# Patient Record
Sex: Male | Born: 1990
Health system: Southern US, Community
[De-identification: ages and names within clinical notes are randomized; demographics above are authoritative.]

## PROBLEM LIST (undated history)

## (undated) DIAGNOSIS — F909 Attention-deficit hyperactivity disorder, unspecified type: Secondary | ICD-10-CM

## (undated) HISTORY — DX: Attention-deficit hyperactivity disorder, unspecified type: F90.9

## (undated) HISTORY — PX: CLOSED REDUCTION SHOULDER DISLOCATION: SUR242

## (undated) HISTORY — PX: FRACTURE SURGERY: SHX138

---
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
## 2013-04-02 ENCOUNTER — Emergency Department (HOSPITAL_COMMUNITY)
Admission: EM | Admit: 2013-04-02 | Discharge: 2013-04-02 | Disposition: A | Discharge status: Home / Self Care | Payer: Self-pay | Attending: Emergency Medicine | Admitting: Emergency Medicine

## 2013-04-02 ENCOUNTER — Encounter (HOSPITAL_COMMUNITY): Payer: Self-pay | Admitting: Emergency Medicine

## 2013-04-02 DIAGNOSIS — T1592XA Foreign body on external eye, part unspecified, left eye, initial encounter: Secondary | ICD-10-CM

## 2013-04-02 DIAGNOSIS — Y939 Activity, unspecified: Secondary | ICD-10-CM | POA: Insufficient documentation

## 2013-04-02 DIAGNOSIS — Y929 Unspecified place or not applicable: Secondary | ICD-10-CM | POA: Insufficient documentation

## 2013-04-02 DIAGNOSIS — T1590XA Foreign body on external eye, part unspecified, unspecified eye, initial encounter: Secondary | ICD-10-CM | POA: Insufficient documentation

## 2013-04-02 MED ORDER — TETRACAINE HCL 0.5 % OP SOLN
2.0000 [drp] | Freq: Once | OPHTHALMIC | Status: AC
  Administered 2013-04-02: 2 [drp] via OPHTHALMIC
  Filled 2013-04-02: qty 2

## 2013-04-02 MED ORDER — TRIAMCINOLONE ACETONIDE 0.025 % EX OINT: TOPICAL_OINTMENT | Freq: Two times a day (BID) | CUTANEOUS | Status: DC

## 2013-04-02 MED ORDER — FLUORESCEIN SODIUM 1 MG OP STRP
ORAL_STRIP | OPHTHALMIC | Status: AC
  Administered 2013-04-02: 03:00:00
  Filled 2013-04-02: qty 1

## 2013-04-02 NOTE — ED Provider Notes (Signed)
Medical screening examination/treatment/procedure(s) were performed by non-physician practitioner and as supervising physician I was immediately available for consultation/collaboration.  Huriel Matt M Amador Braddy, MD 04/02/13 0646 

## 2013-04-02 NOTE — ED Notes (Signed)
PT. REPORTS FOREIGN OBJECT AT LEFT EYE WITH IRRITATION /REDDNESS AND WATERY EYE. NO BLURRED VISION .

## 2013-04-02 NOTE — ED Provider Notes (Addendum)
History     CSN: [id number]  Arrival date & time 04/02/13  0150   First MD Initiated Contact with Patient 04/02/13 0210      Chief Complaint  Patient presents with  . Foreign Body in Eye    (Consider location/radiation/quality/duration/timing/severity/associated sxs/prior treatment) Patient is a 22 y.o. male presenting with foreign body in eye. The history is provided by the patient.  Foreign Body in Eye This is a new problem. The current episode started today (3 hrs ago ). The problem occurs constantly. The problem has been unchanged. Pertinent negatives include no abdominal pain, anorexia, arthralgias, change in bowel habit, chest pain, chills, congestion, fatigue, fever, headaches, joint swelling, myalgias, nausea, neck pain, numbness, rash, sore throat, swollen glands, urinary symptoms, vertigo, visual change, vomiting or weakness. Nothing aggravates the symptoms. He has tried nothing for the symptoms. The treatment provided no relief.    History reviewed. No pertinent past medical history.  History reviewed. No pertinent past surgical history.  No family history on file.  History  Substance Use Topics  . Smoking status: Never Smoker   . Smokeless tobacco: Not on file  . Alcohol Use: Yes      Review of Systems  Constitutional: Negative for fever, chills and fatigue.  HENT: Negative for congestion, sore throat and neck pain.   Eyes: Negative for photophobia, pain, discharge, redness, itching and visual disturbance.       FB sensation  Cardiovascular: Negative for chest pain.  Gastrointestinal: Negative for nausea, vomiting, abdominal pain, anorexia and change in bowel habit.  Musculoskeletal: Negative for myalgias, joint swelling and arthralgias.  Skin: Negative for rash.  Neurological: Negative for vertigo, weakness, numbness and headaches.  All other systems reviewed and are negative.    Allergies  Review of patient's allergies indicates no known  allergies.  Home Medications   Current Outpatient Rx  Name  Route  Sig  Dispense  Refill  . naphazoline-glycerin (CLEAR EYES) 0.012-0.2 % SOLN   Both Eyes   Place 1-2 drops into both eyes every 4 (four) hours as needed (for irritation).           BP 127/76  Pulse 65  Temp(Src) 97.7 F (36.5 C) (Oral)  Resp 14  SpO2 100%  Physical Exam  Nursing note and vitals reviewed. Constitutional: He is oriented to person, place, and time. He appears well-developed and well-nourished. No distress.  HENT:  Head: Normocephalic and atraumatic.  Eyes: Conjunctivae and EOM are normal. Pupils are equal, round, and reactive to light. No foreign body present in the left eye.  No tenderness to palpation over temporal arteries or orbital region. Pain free EOMs, visual acuity equal bilaterally, no increase in IOPs, no proptosis, lid swelling, hyphema, purulent discharge from eyes, or consensual photophobia.  Eyelids everted, no evidence of FB.  Fluorescein study: no corneal uptake, dendritic pattern, or evidence of corneal abrasions.  Neck: Normal range of motion. Neck supple.  Pulmonary/Chest: Effort normal.  Neurological: He is alert and oriented to person, place, and time.  Skin: Skin is warm and dry. No rash noted. He is not diaphoretic.  Psychiatric: His behavior is normal.    ED Course  FOREIGN BODY REMOVAL Date/Time: 04/02/2013 1:03 AM Performed by: Jaci Carrel Authorized by: Jaci Carrel Consent: Verbal consent obtained. Risks and benefits: risks, benefits and alternatives were discussed Consent given by: patient Patient understanding: patient states understanding of the procedure being performed Patient consent: the patient's understanding of the procedure matches consent given Patient identity  confirmed: arm band and verbally with patient Body area: eye Location details: left conjunctiva Patient sedated: no Localization method: eyelid eversion and visualized Removal mechanism:  irrigation and eyelid eversion Eye examined with fluorescein. No fluorescein uptake. No residual rust ring present. Depth: superficial Complexity: simple 1 objects recovered. Objects recovered: dirt spec Post-procedure assessment: foreign body removed Patient tolerance: Patient tolerated the procedure well with no immediate complications.   (including critical care time)  Labs Reviewed - No data to display No results found.   No diagnosis found.    MDM   FB (dirt) removed from left eye  Viral conjunctivitis  No purulent discharge, corneal abrasions, entrapment, consensual photophobia, or dendritic staining with fluorescein study.  Presentation non-concerning for iritis, bacterial conjunctivitis, corneal abrasions, or HSV.  No antibiotics are indicated  Patient advised to followup with ophthalmologist if symptoms persist or worsen in any way including vision change or purulent discharge.  Patient verbalizes understanding and is agreeable with discharge.         Jaci Carrel, PA-C 04/02/13 0238  Jaci Carrel, PA-C 04/15/13 [phone]

## 2013-04-15 NOTE — ED Provider Notes (Signed)
Medical screening examination/treatment/procedure(s) were performed by non-physician practitioner and as supervising physician I was immediately available for consultation/collaboration.  Olivia Mackie, MD 04/15/13 [phone]

## 2013-10-24 ENCOUNTER — Encounter: Payer: Self-pay | Admitting: *Deleted

## 2013-10-27 ENCOUNTER — Encounter: Payer: Self-pay | Admitting: Family Medicine

## 2013-10-27 ENCOUNTER — Ambulatory Visit (INDEPENDENT_AMBULATORY_CARE_PROVIDER_SITE_OTHER): Payer: Self-pay | Admitting: Family Medicine

## 2013-10-27 VITALS — BP 118/78 | Temp 98.2°F | Ht 74.0 in | Wt 206.0 lb

## 2013-10-27 DIAGNOSIS — R21 Rash and other nonspecific skin eruption: Secondary | ICD-10-CM

## 2013-10-27 MED ORDER — DOXYCYCLINE HYCLATE 100 MG PO CAPS: 100.0000 mg | ORAL_CAPSULE | Freq: Two times a day (BID) | ORAL | Status: DC

## 2013-10-27 MED ORDER — PREDNISONE 20 MG PO TABS: ORAL_TABLET | ORAL | Status: DC

## 2013-10-27 MED ORDER — TRIAMCINOLONE ACETONIDE 0.1 % EX CREA: 1.0000 "application " | TOPICAL_CREAM | Freq: Two times a day (BID) | CUTANEOUS | Status: DC

## 2013-10-27 NOTE — Progress Notes (Signed)
  Subjective:    Patient ID: Peter Yu, male    DOB: [birth year], 22 y.o.   MRN: [id number]  HPI Patient is here today due to his eczema. He is out of his medication and would also like to know if there are any tips on how to treat it.   No other concerns.   Acts up frequently  On further history patient has had slight penile discharge off-and-on for several weeks. He is sexually active. He has no insurance and would prefer not to be tested.  Review of Systems No abdominal pain no fever no chills no rash ROS otherwise negative    Objective:   Physical Exam  Alert lungs clear. Heart regular rate rhythm. Arms and legs significant eczema. Patchy on feet.      Assessment & Plan:  Impression flare of eczema. #2 urethritis with patient unable to pay for definitive tests plan Doxy 100 twice a day 10 days. Prednisone taper. Triamcinolone cream twice a day to affected area. Symptomatic care discussed. WSL

## 2015-08-13 HISTORY — PX: OPEN REDUCTION INTERNAL FIXATION (ORIF) TIBIA/FIBULA FRACTURE: SHX5992

## 2015-08-30 ENCOUNTER — Other Ambulatory Visit: Payer: Self-pay | Admitting: Family Medicine

## 2015-09-13 ENCOUNTER — Ambulatory Visit (HOSPITAL_COMMUNITY): Payer: BLUE CROSS/BLUE SHIELD | Attending: Orthopedic Surgery | Admitting: Physical Therapy

## 2015-09-13 DIAGNOSIS — R29898 Other symptoms and signs involving the musculoskeletal system: Secondary | ICD-10-CM | POA: Diagnosis present

## 2015-09-13 DIAGNOSIS — R269 Unspecified abnormalities of gait and mobility: Secondary | ICD-10-CM | POA: Diagnosis present

## 2015-09-13 DIAGNOSIS — S82402F Unspecified fracture of shaft of left fibula, subsequent encounter for open fracture type IIIA, IIIB, or IIIC with routine healing: Secondary | ICD-10-CM

## 2015-09-13 DIAGNOSIS — M25672 Stiffness of left ankle, not elsewhere classified: Secondary | ICD-10-CM

## 2015-09-13 DIAGNOSIS — X58XXXD Exposure to other specified factors, subsequent encounter: Secondary | ICD-10-CM | POA: Diagnosis not present

## 2015-09-13 DIAGNOSIS — S82202F Unspecified fracture of shaft of left tibia, subsequent encounter for open fracture type IIIA, IIIB, or IIIC with routine healing: Secondary | ICD-10-CM | POA: Insufficient documentation

## 2015-09-13 DIAGNOSIS — R262 Difficulty in walking, not elsewhere classified: Secondary | ICD-10-CM | POA: Diagnosis present
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
## 2015-09-14 ENCOUNTER — Ambulatory Visit (HOSPITAL_COMMUNITY): Payer: BLUE CROSS/BLUE SHIELD | Admitting: Physical Therapy

## 2015-09-14 DIAGNOSIS — S82202F Unspecified fracture of shaft of left tibia, subsequent encounter for open fracture type IIIA, IIIB, or IIIC with routine healing: Secondary | ICD-10-CM | POA: Diagnosis not present

## 2015-09-14 DIAGNOSIS — M25672 Stiffness of left ankle, not elsewhere classified: Secondary | ICD-10-CM

## 2015-09-14 DIAGNOSIS — R269 Unspecified abnormalities of gait and mobility: Secondary | ICD-10-CM

## 2015-09-14 DIAGNOSIS — R29898 Other symptoms and signs involving the musculoskeletal system: Secondary | ICD-10-CM

## 2015-09-14 DIAGNOSIS — R262 Difficulty in walking, not elsewhere classified: Secondary | ICD-10-CM

## 2015-09-14 DIAGNOSIS — S82402F Unspecified fracture of shaft of left fibula, subsequent encounter for open fracture type IIIA, IIIB, or IIIC with routine healing: Secondary | ICD-10-CM

## 2015-09-14 NOTE — Therapy (Signed)
[facility] [address] [place], [place], [address] Phone: [phone]   Fax:  [phone]  Physical Therapy Treatment  Patient Details  Name: Peter Yu MRN: [id number] Date of Birth: 09-03-1991 Referring Provider:  Merlyn Albert, MD  Encounter Date: 09/14/2015      PT End of Session - 09/14/15 1316    Visit Number 2   Number of Visits 18   Date for PT Re-Evaluation 10/13/15   Authorization Type BCBS- 50 visit limit   Authorization Time Period 09/13/15-11/08/15   PT Start Time 1102   PT Stop Time 1145   PT Time Calculation (min) 43 min   Activity Tolerance Patient tolerated treatment well   Behavior During Therapy [facility] for tasks assessed/performed      Past Medical History  Diagnosis Date  . ADHD (attention deficit hyperactivity disorder)     No past surgical history on file.  There were no vitals filed for this visit.  Visit Diagnosis:  Fracture of left tibia and fibula, open type III, with routine healing, subsequent encounter  Difficulty walking  Stiffness of left ankle joint  Weakness of left leg  Abnormality of gait      Subjective Assessment - 09/14/15 1313    Subjective Patient reports that he is doing well today, no pain; arrived in fracture boot continuing to use crutch. Does admit that he has been intermittently ambulating without boot.    Currently in Pain? No/denies                         [facility] Adult PT Treatment/Exercise - 09/14/15 0001    Ambulation/Gait   Ambulation/Gait Yes   Ambulation/Gait Assistance 6: Modified independent (Device/Increase time)   Ambulation Distance (Feet) --  70, 70   Assistive device None   Stairs Assistance 6: Modified independent (Device/Increase time)   Stair Management Technique No rails   Number of Stairs 7   Height of Stairs 4   Gait Comments difficulty with heel-toe gait pattern, reduced gait speed, reduced stance L LE/step R LE;  unsteadiness possibly due to weight of boot    Knee/Hip Exercises: Seated   Long Arc Quad Left;1 set;10 reps   Long [employer] Limitations 2 second holds    Hamstring Curl Left;1 set;15 reps   Hamstring Limitations 2 second holds    Knee/Hip Exercises: Supine   Bridges Both;1 set;15 reps   Bridges Limitations unilateral with R LE only    Straight Leg Raises Both;1 set;15 reps   Straight Leg Raise with External Rotation Both;1 set;15 reps   Knee/Hip Exercises: Sidelying   Hip ABduction Both;1 set;15 reps   Knee/Hip Exercises: Prone   Hip Extension Both;1 set;15 reps   Ankle Exercises: Seated   Other Seated Ankle Exercises ankle PF/DF, eversion/inversion, circles and alphabet with PT facilitation for correct ankle range                 PT Education - 09/14/15 1316    Education provided Yes   Education Details reviewed initial eval; reinforced WB  with boot only; assigned HEP    Person(s) Educated Patient   Methods Explanation;Handout   Comprehension Verbalized understanding          PT Short Term Goals - 09/13/15 1601    PT SHORT TERM GOAL #1   Title Pt will be independent with HEP.    Time 3   Period Weeks   Status New  PT SHORT TERM GOAL #2   Title Improve left ankle rom by 10 degrees in all planes to improve mobility and gait mechanics.    Time 3   Period Weeks   Status New   PT SHORT TERM GOAL #3   Title Improve ankle strength to 4/5 to improve gait mechanics and ankle stability.    Time 3   Period Weeks   Status New   PT SHORT TERM GOAL #4   Title Pt will ambulate 150 feet in fracture boot with LRAD or no AD.   Time 3   Period Weeks   Status New           PT Long Term Goals - 09/13/15 1605    PT LONG TERM GOAL #1   Title Pt will be independent with advanced HEP for ankle ROM, strengthening, and proprioception.   Time 6   Period Weeks   Status New   PT LONG TERM GOAL #2   Title Improve ankle ROM by 15 degrees in all planes to improve mobility  and normalize gait mechanics.   Time 6   Period Weeks   Status New   PT LONG TERM GOAL #3   Title Improve left hip and ankle strength to 4+/5 or greater to improve gait mechanics.   Time 6   Period Weeks   Status New   PT LONG TERM GOAL #4   Title Decrease edema in L ankle evidenced by equal circumfrential measurements at superior aspect of malleoli.   Time 6   Period Weeks   Status New   PT LONG TERM GOAL #5   Title Pt will ambulate 1,000 feet with no AD and with proper gait mechanics to return to PLOF of community ambulation.    Time 6   Period Weeks   Status New   Additional Long Term Goals   Additional Long Term Goals Yes   PT LONG TERM GOAL #6   Title Pt will maintain SLS on LLE for 60 seconds or greater to demonstrate good balance and proprioception in LLE.    Time 6   Period Weeks   Status New               Plan - 09/14/15 1317    Clinical Impression Statement Intoruced functional ankle mobility exercises, hiop strrengthening, and gait and stairs without crutches today in boot. Required Min(A)/min facilitation for proper performance of ankle exercises due to stiffness initially but this did improve. Noted that wound still dressed with gauze. Assigned HEP. Reinforced taht he should only weight bear in boot for now per MD orders.    Pt will benefit from skilled therapeutic intervention in order to improve on the following deficits Abnormal gait;Decreased activity tolerance;Decreased balance;Decreased endurance;Decreased range of motion;Decreased skin integrity;Difficulty walking;Increased edema;Impaired flexibility;Pain   Rehab Potential Good   PT Frequency 3x / week   PT Duration 6 weeks   PT Treatment/Interventions ADLs/Self Care Home Management;Gait training;Stair training;Functional mobility training;Therapeutic activities;Therapeutic exercise;Balance training;Neuromuscular re-education;Patient/family education;Manual techniques;Scar mobilization;Passive range of  motion   PT Next Visit Plan continue mobility exercises for ankle; hip strengthening; gait and stair training; needs boot for weight bearing    Consulted and Agree with Plan of Care Patient        Problem List Patient Active Problem List   Diagnosis Date Noted  . SHOULDER DISLOCATION-RECURRENT 11/06/2007    Nedra Hai PT, DPT [phone]  [facility] [address] [place],  [place], [address] Phone: [phone]   Fax:  [phone]

## 2015-09-14 NOTE — Patient Instructions (Signed)
e Bend (Dorsiflexion and Plantar Flexion)   Sitting or lying down, point toes up, keeping both heels on floor. Then press toes to floor, raising heels. Repeat __15__ times. Do _2-3___ sessions per day.  http://gt2.exer.us/403   Copyright  VHI. All rights reserved.   Ankle Eversion (DO THIS IN SITTING)   With ankle movement only, move left foot so sole of foot faces outward. Repeat _15-20___ times per session. Do _2-3___ sessions per day. Position: Standing   Copyright  VHI. All rights reserved.   Ankle Inversion (DO IN SITTING)   With ankle movement only, move left foot so sole of foot faces inward. Repeat __15-20__ times per session. Do _2-3___ sessions per day. Position: Standing   Copyright  VHI. All rights reserved.      ANKLE CIRCLES  Move your ankle in a circular pattern one direction for several repetitions and then reverse the direction. Repeat 10 times each direction, 2-3 times per day.    ANKLE ABC's   While in a seated position, write out the alphabet in the air with your big toe.  Your ankle should be moving as you perform this. Repeat 1 time, 2-3 times per day.    STRAIGHT LEG RAISE - SLR  While lying or sitting, raise up your leg with a straight knee.  Keep the opposite knee bent with the foot planted to the ground.  Repeat 10 times, 2-3 times per day.    HIP ABDUCTION - SIDELYING  While lying on your side, slowly raise up your top leg to the side. Keep your knee straight and maintain your toes pointed forward the entire time.   The bottom leg can be bent to stabilize your body.  Repeat 10 times, 2-3 tiomes per day.

## 2015-09-16 ENCOUNTER — Ambulatory Visit (HOSPITAL_COMMUNITY): Payer: BLUE CROSS/BLUE SHIELD

## 2015-09-16 DIAGNOSIS — R269 Unspecified abnormalities of gait and mobility: Secondary | ICD-10-CM

## 2015-09-16 DIAGNOSIS — R262 Difficulty in walking, not elsewhere classified: Secondary | ICD-10-CM

## 2015-09-16 DIAGNOSIS — S82402F Unspecified fracture of shaft of left fibula, subsequent encounter for open fracture type IIIA, IIIB, or IIIC with routine healing: Secondary | ICD-10-CM

## 2015-09-16 DIAGNOSIS — S82202F Unspecified fracture of shaft of left tibia, subsequent encounter for open fracture type IIIA, IIIB, or IIIC with routine healing: Secondary | ICD-10-CM | POA: Diagnosis not present

## 2015-09-16 DIAGNOSIS — R29898 Other symptoms and signs involving the musculoskeletal system: Secondary | ICD-10-CM

## 2015-09-16 DIAGNOSIS — M25672 Stiffness of left ankle, not elsewhere classified: Secondary | ICD-10-CM

## 2015-09-16 NOTE — Therapy (Signed)
[place] [facility] [address] [place], [place], [address] Phone: [phone]   Fax:  [phone]  Physical Therapy Treatment  Patient Details  Name: Peter Yu MRN: [id number] Date of Birth: [birth year] Referring Provider:  Kevan Rosebush Tyler-Par*  Encounter Date: 09/16/2015      PT End of Session - 09/16/15 1131    Visit Number 3   Number of Visits 18   Authorization Type BCBS- 50 visit limit   Authorization Time Period 09/13/15-11/08/15   PT Start Time 1058   PT Stop Time 1147   PT Time Calculation (min) 49 min   Activity Tolerance Patient tolerated treatment well   Behavior During Therapy [facility] for tasks assessed/performed      Past Medical History  Diagnosis Date  . ADHD (attention deficit hyperactivity disorder)     No past surgical history on file.  There were no vitals filed for this visit.  Visit Diagnosis:  Fracture of left tibia and fibula, open type III, with routine healing, subsequent encounter  Difficulty walking  Stiffness of left ankle joint  Weakness of left leg  Abnormality of gait      Subjective Assessment - 09/16/15 1103    Subjective Pt reports he has been completeing HEP for ankle and stated he has tried walking without crutches at home.     Currently in Pain? Yes   Pain Score 3    Pain Location Ankle   Pain Orientation Left   Pain Descriptors / Indicators Tightness             [facility] Adult PT Treatment/Exercise - 09/16/15 0001    Ambulation/Gait   Ambulation/Gait Yes   Ambulation/Gait Assistance 6: Modified independent (Device/Increase time)  Lt LE in boot   Ambulation Distance (Feet) 226 Feet   Assistive device None   Stairs Assistance 6: Modified independent (Device/Increase time)   Stair Management Technique One rail Left   Number of Stairs 4  3 sets    Height of Stairs 7   Gait Comments difficulty with heel-toe gait pattern, reduced gait speed, reduced stance L LE/step R LE;  unsteadiness possibly due to weight of boot    Exercises   Exercises Knee/Hip   Knee/Hip Exercises: Stretches   Quad Stretch 2 reps;30 seconds   Quad Stretch Limitations prone with rope   Knee/Hip Exercises: Seated   Long Arc Quad Left;15 reps;Weights   Long Arc Quad Weight 2 lbs.   Long [employer] Limitations 2 second holds    Hamstring Curl Left;15 reps   Hamstring Limitations 2 second holds    Knee/Hip Exercises: Supine   Bridges Right;15 reps   Bridges Limitations NWB Lt LE   Straight Leg Raises Both;15 reps   Straight Leg Raises Limitations 2#   Knee/Hip Exercises: Sidelying   Hip ABduction Both;1 set;15 reps   Hip ABduction Limitations 2#   Knee/Hip Exercises: Prone   Hip Extension Both;1 set;15 reps   Hip Extension Limitations 2#   Ankle Exercises: Seated   Other Seated Ankle Exercises ankle PF/DF, eversion/inversion, circles and alphabet with PT facilitation for correct ankle range               PT Short Term Goals - 09/13/15 1601    PT SHORT TERM GOAL #1   Title Pt will be independent with HEP.    Time 3   Period Weeks   Status New   PT SHORT TERM GOAL #2   Title Improve left ankle  rom by 10 degrees in all planes to improve mobility and gait mechanics.    Time 3   Period Weeks   Status New   PT SHORT TERM GOAL #3   Title Improve ankle strength to 4/5 to improve gait mechanics and ankle stability.    Time 3   Period Weeks   Status New   PT SHORT TERM GOAL #4   Title Pt will ambulate 150 feet in fracture boot with LRAD or no AD.   Time 3   Period Weeks   Status New           PT Long Term Goals - 09/13/15 1605    PT LONG TERM GOAL #1   Title Pt will be independent with advanced HEP for ankle ROM, strengthening, and proprioception.   Time 6   Period Weeks   Status New   PT LONG TERM GOAL #2   Title Improve ankle ROM by 15 degrees in all planes to improve mobility and normalize gait mechanics.   Time 6   Period Weeks   Status New   PT LONG  TERM GOAL #3   Title Improve left hip and ankle strength to 4+/5 or greater to improve gait mechanics.   Time 6   Period Weeks   Status New   PT LONG TERM GOAL #4   Title Decrease edema in L ankle evidenced by equal circumfrential measurements at superior aspect of malleoli.   Time 6   Period Weeks   Status New   PT LONG TERM GOAL #5   Title Pt will ambulate 1,000 feet with no AD and with proper gait mechanics to return to PLOF of community ambulation.    Time 6   Period Weeks   Status New   Additional Long Term Goals   Additional Long Term Goals Yes   PT LONG TERM GOAL #6   Title Pt will maintain SLS on LLE for 60 seconds or greater to demonstrate good balance and proprioception in LLE.    Time 6   Period Weeks   Status New               Plan - 09/16/15 1131    Clinical Impression Statement Session focus on improving ankle mobilty with therapist facilitation to reduce compensation and LE strengthening.  Added resistance (RTB) /weight (2#) with hip and knee strengthening with visible musculature noted, no reports of increased pain through session.  Pt with wound dressed with gauze.  Gait and stair training complete without crutches with cueing for increased Lt stance phase and increased Rt LE step length.  Noted edema around ankle, pt encouraged to elevate LE with ankle pumps and ice for edema and pain control.   PT Next Visit Plan continue mobility exercises for ankle; hip strengthening; gait and stair training; needs boot for weight bearing         Problem List Patient Active Problem List   Diagnosis Date Noted  . SHOULDER DISLOCATION-RECURRENT 11/06/2007   Becky Sax, LPTA; CBIS [phone]  Juel Burrow 09/16/2015, 12:05 PM  [facility] [address] [place], [place], [address] Phone: [phone]   Fax:  [phone]

## 2015-09-19 ENCOUNTER — Ambulatory Visit (HOSPITAL_COMMUNITY): Payer: BLUE CROSS/BLUE SHIELD | Attending: Orthopedic Surgery | Admitting: Physical Therapy

## 2015-09-19 DIAGNOSIS — R29898 Other symptoms and signs involving the musculoskeletal system: Secondary | ICD-10-CM | POA: Insufficient documentation

## 2015-09-19 DIAGNOSIS — S82402F Unspecified fracture of shaft of left fibula, subsequent encounter for open fracture type IIIA, IIIB, or IIIC with routine healing: Secondary | ICD-10-CM | POA: Insufficient documentation

## 2015-09-19 DIAGNOSIS — S82202F Unspecified fracture of shaft of left tibia, subsequent encounter for open fracture type IIIA, IIIB, or IIIC with routine healing: Secondary | ICD-10-CM | POA: Insufficient documentation

## 2015-09-19 DIAGNOSIS — X58XXXD Exposure to other specified factors, subsequent encounter: Secondary | ICD-10-CM | POA: Diagnosis not present

## 2015-09-19 DIAGNOSIS — R269 Unspecified abnormalities of gait and mobility: Secondary | ICD-10-CM | POA: Insufficient documentation

## 2015-09-19 DIAGNOSIS — R262 Difficulty in walking, not elsewhere classified: Secondary | ICD-10-CM | POA: Diagnosis present

## 2015-09-19 DIAGNOSIS — M25672 Stiffness of left ankle, not elsewhere classified: Secondary | ICD-10-CM | POA: Insufficient documentation

## 2015-09-19 NOTE — Therapy (Signed)
[place] [facility] [address] [place], [place], [address] Phone: [phone]   Fax:  [phone]  Physical Therapy Treatment  Patient Details  Name: FREDI GEILER MRN: [id number] Date of Birth: 1991/01/05 Referring Provider:  Kevan Rosebush Tyler-Par*  Encounter Date: 09/19/2015      PT End of Session - 09/19/15 1205    Visit Number 4   Number of Visits 18   Date for PT Re-Evaluation 10/13/15   Authorization Type BCBS- 50 visit limit   Authorization Time Period 09/13/15-11/08/15   PT Start Time 1100   PT Stop Time 1148   PT Time Calculation (min) 48 min   Activity Tolerance Patient tolerated treatment well   Behavior During Therapy [facility] for tasks assessed/performed      Past Medical History  Diagnosis Date  . ADHD (attention deficit hyperactivity disorder)     No past surgical history on file.  There were no vitals filed for this visit.  Visit Diagnosis:  Stiffness of left ankle joint  Difficulty walking  Weakness of left leg  Abnormality of gait      Subjective Assessment - 09/19/15 1102    Subjective Pt reports that he feels like he walked too much on Friday after he left, and he started to feel pain and tension in the front of his ankle, so he rested over the weekend and didn't do his exercises.    Currently in Pain? No/denies   Pain Score 0-No pain                         [facility] Adult PT Treatment/Exercise - 09/19/15 0001    Ambulation/Gait   Ambulation/Gait Yes   Ambulation/Gait Assistance 6: Modified independent (Device/Increase time)  Lt LE in boot   Ambulation Distance (Feet) 100 Feet   Assistive device R Axillary Crutch   Ankle Exercises: Seated   ABC's 1 rep   Ankle Circles/Pumps 10 reps;AROM   Towel Crunch 3 reps   Towel Inversion/Eversion --  15 reps   BAPS Level 2;Sitting;10 reps   Ankle Exercises: Supine   T-Band green tband 4-way                PT Education - 09/19/15  1205    Education provided Yes   Education Details Updated HEP   Person(s) Educated Patient   Methods Explanation;Handout   Comprehension Verbalized understanding;Returned demonstration          PT Short Term Goals - 09/13/15 1601    PT SHORT TERM GOAL #1   Title Pt will be independent with HEP.    Time 3   Period Weeks   Status New   PT SHORT TERM GOAL #2   Title Improve left ankle rom by 10 degrees in all planes to improve mobility and gait mechanics.    Time 3   Period Weeks   Status New   PT SHORT TERM GOAL #3   Title Improve ankle strength to 4/5 to improve gait mechanics and ankle stability.    Time 3   Period Weeks   Status New   PT SHORT TERM GOAL #4   Title Pt will ambulate 150 feet in fracture boot with LRAD or no AD.   Time 3   Period Weeks   Status New           PT Long Term Goals - 09/13/15 1605    PT LONG TERM GOAL #1  Title Pt will be independent with advanced HEP for ankle ROM, strengthening, and proprioception.   Time 6   Period Weeks   Status New   PT LONG TERM GOAL #2   Title Improve ankle ROM by 15 degrees in all planes to improve mobility and normalize gait mechanics.   Time 6   Period Weeks   Status New   PT LONG TERM GOAL #3   Title Improve left hip and ankle strength to 4+/5 or greater to improve gait mechanics.   Time 6   Period Weeks   Status New   PT LONG TERM GOAL #4   Title Decrease edema in L ankle evidenced by equal circumfrential measurements at superior aspect of malleoli.   Time 6   Period Weeks   Status New   PT LONG TERM GOAL #5   Title Pt will ambulate 1,000 feet with no AD and with proper gait mechanics to return to PLOF of community ambulation.    Time 6   Period Weeks   Status New   Additional Long Term Goals   Additional Long Term Goals Yes   PT LONG TERM GOAL #6   Title Pt will maintain SLS on LLE for 60 seconds or greater to demonstrate good balance and proprioception in LLE.    Time 6   Period Weeks    Status New               Plan - 09/19/15 1205    Clinical Impression Statement Treatment session focused on ankle mobility, ROM, and strengthening today. Pt had difficulty with completing rotational movements on BAPS board and with ankle circles. Gait training was attempted without AD, pt reported that he was still having pain when ambulating, and single crutch was used with reports of decreased pain.    PT Next Visit Plan Continue with ankle mobility/strengthening and hip/knee strengthening.         Problem List Patient Active Problem List   Diagnosis Date Noted  . SHOULDER DISLOCATION-RECURRENT 11/06/2007    Leona Singleton, PT, DPT [phone] 09/19/2015, 12:08 PM  [facility] [address] [place], [place], [address] Phone: [phone]   Fax:  [phone]

## 2015-09-19 NOTE — Patient Instructions (Signed)
TOWEL SLIDES - INVERSION  While seated, use a towel and slide it with your foot across the floor in an inward direction.    Be sure to keep your heel in contact with the floor the entire time.   Seated Towel Scrunches with foot.  Sit with a towel on a smooth surface. Place your foot on the towel. Using your toes, scrunch the towel up. Unfold fold the towel and repeat the process.   Ankle Plantarflexion  Plantar flexion with band resistance Ankle Strength - Tubing Dorsiflexion   Start with your toes pointed away from your body. Move your toes back towards your body. The tubing should be resisting your movement. Return slowly to the starting position.

## 2015-09-21 ENCOUNTER — Ambulatory Visit (HOSPITAL_COMMUNITY): Payer: BLUE CROSS/BLUE SHIELD | Admitting: Physical Therapy

## 2015-09-21 DIAGNOSIS — R269 Unspecified abnormalities of gait and mobility: Secondary | ICD-10-CM

## 2015-09-21 DIAGNOSIS — R29898 Other symptoms and signs involving the musculoskeletal system: Secondary | ICD-10-CM

## 2015-09-21 DIAGNOSIS — M25672 Stiffness of left ankle, not elsewhere classified: Secondary | ICD-10-CM | POA: Diagnosis not present

## 2015-09-21 DIAGNOSIS — R262 Difficulty in walking, not elsewhere classified: Secondary | ICD-10-CM

## 2015-09-21 NOTE — Therapy (Signed)
[place] [facility] [address] [place], [place], [address] Phone: [phone]   Fax:  [phone]  Physical Therapy Treatment  Patient Details  Name: Peter Yu MRN: [id number] Date of Birth: 1991/05/03 Referring Provider:  Kevan Rosebush Tyler-Par*  Encounter Date: 09/21/2015      PT End of Session - 09/21/15 1204    Visit Number 5   Number of Visits 18   Date for PT Re-Evaluation 10/13/15   Authorization Type BCBS- 50 visit limit   Authorization Time Period 09/13/15-11/08/15   PT Start Time 1100   PT Stop Time 1151   PT Time Calculation (min) 51 min   Activity Tolerance Patient tolerated treatment well   Behavior During Therapy [facility] for tasks assessed/performed      Past Medical History  Diagnosis Date  . ADHD (attention deficit hyperactivity disorder)     No past surgical history on file.  There were no vitals filed for this visit.  Visit Diagnosis:  Difficulty walking  Stiffness of left ankle joint  Weakness of left leg  Abnormality of gait      Subjective Assessment - 09/21/15 1103    Subjective Pt reports that his leg feels much better today, he no longer has pain with walking. Pt reports that he rested his leg a lot   Currently in Pain? No/denies   Pain Score 0-No pain                         [facility] Adult PT Treatment/Exercise - 09/21/15 0001    Ambulation/Gait   Ambulation/Gait Yes   Ambulation/Gait Assistance 6: Modified independent (Device/Increase time)  Lt LE in boot   Ambulation Distance (Feet) 226 Feet   Assistive device R Axillary Crutch   Gait Comments cueing for proper step length   Manual Therapy   Manual Therapy Soft tissue mobilization   Soft tissue mobilization STM to L FHL,    Ankle Exercises: Seated   ABC's 2 reps   Ankle Circles/Pumps 10 reps;AROM   Towel Crunch 3 reps   Towel Inversion/Eversion --  15 reps   BAPS Level 2;Sitting;15 reps   Other Seated Ankle Exercises  seated rockerboard                   PT Short Term Goals - 09/13/15 1601    PT SHORT TERM GOAL #1   Title Pt will be independent with HEP.    Time 3   Period Weeks   Status New   PT SHORT TERM GOAL #2   Title Improve left ankle rom by 10 degrees in all planes to improve mobility and gait mechanics.    Time 3   Period Weeks   Status New   PT SHORT TERM GOAL #3   Title Improve ankle strength to 4/5 to improve gait mechanics and ankle stability.    Time 3   Period Weeks   Status New   PT SHORT TERM GOAL #4   Title Pt will ambulate 150 feet in fracture boot with LRAD or no AD.   Time 3   Period Weeks   Status New           PT Long Term Goals - 09/13/15 1605    PT LONG TERM GOAL #1   Title Pt will be independent with advanced HEP for ankle ROM, strengthening, and proprioception.   Time 6   Period Weeks   Status New  PT LONG TERM GOAL #2   Title Improve ankle ROM by 15 degrees in all planes to improve mobility and normalize gait mechanics.   Time 6   Period Weeks   Status New   PT LONG TERM GOAL #3   Title Improve left hip and ankle strength to 4+/5 or greater to improve gait mechanics.   Time 6   Period Weeks   Status New   PT LONG TERM GOAL #4   Title Decrease edema in L ankle evidenced by equal circumfrential measurements at superior aspect of malleoli.   Time 6   Period Weeks   Status New   PT LONG TERM GOAL #5   Title Pt will ambulate 1,000 feet with no AD and with proper gait mechanics to return to PLOF of community ambulation.    Time 6   Period Weeks   Status New   Additional Long Term Goals   Additional Long Term Goals Yes   PT LONG TERM GOAL #6   Title Pt will maintain SLS on LLE for 60 seconds or greater to demonstrate good balance and proprioception in LLE.    Time 6   Period Weeks   Status New               Plan - 09/21/15 1205    Clinical Impression Statement Continued with ankle mobility and strengthening today. Pt is  demonstrating improved control with rotational movements when completing BAPS board, but continues to have difficulty with active ankle circles and inversion/eversion. Pt required increased verbal cueing during ambulation to improve step length and for equal weightbearing.    PT Next Visit Plan Continue with ankle mobility/strengthening and hip/knee strengthening.         Problem List Patient Active Problem List   Diagnosis Date Noted  . SHOULDER DISLOCATION-RECURRENT 11/06/2007    Leona Singleton, PT, DPT [phone] 09/21/2015, 12:11 PM  [place] [facility] [address] [place], [place], [address] Phone: [phone]   Fax:  [phone]

## 2015-09-23 ENCOUNTER — Telehealth (HOSPITAL_COMMUNITY): Payer: Self-pay

## 2015-09-23 ENCOUNTER — Ambulatory Visit (HOSPITAL_COMMUNITY): Payer: BLUE CROSS/BLUE SHIELD

## 2015-09-23 NOTE — Telephone Encounter (Signed)
No show, called and spoke to mother of client stated they thought apt time was later today, pt was reminded of next apt date and time.    [address] LPTA; CBIS [phone]

## 2015-09-26 ENCOUNTER — Ambulatory Visit (HOSPITAL_COMMUNITY): Payer: BLUE CROSS/BLUE SHIELD | Admitting: Physical Therapy

## 2015-09-26 DIAGNOSIS — M25672 Stiffness of left ankle, not elsewhere classified: Secondary | ICD-10-CM | POA: Diagnosis not present

## 2015-09-26 DIAGNOSIS — R29898 Other symptoms and signs involving the musculoskeletal system: Secondary | ICD-10-CM

## 2015-09-26 DIAGNOSIS — R262 Difficulty in walking, not elsewhere classified: Secondary | ICD-10-CM

## 2015-09-26 DIAGNOSIS — S82402F Unspecified fracture of shaft of left fibula, subsequent encounter for open fracture type IIIA, IIIB, or IIIC with routine healing: Secondary | ICD-10-CM

## 2015-09-26 DIAGNOSIS — S82202F Unspecified fracture of shaft of left tibia, subsequent encounter for open fracture type IIIA, IIIB, or IIIC with routine healing: Secondary | ICD-10-CM

## 2015-09-26 DIAGNOSIS — R269 Unspecified abnormalities of gait and mobility: Secondary | ICD-10-CM

## 2015-09-26 NOTE — Therapy (Signed)
[place], [place], [address] Phone: [phone]   Fax:  [phone]  Physical Therapy Treatment  Patient Details  Name: CLARY MEEKER MRN: [id number] Date of Birth: 13-Mar-1991 Referring Provider:  Sallye Lat Tyler-Par*  Encounter Date: 09/26/2015      PT End of Session - 09/26/15 1341    Visit Number 6   Number of Visits 18   Date for PT Re-Evaluation 10/13/15   Authorization Type BCBS- 50 visit limit   Authorization Time Period 09/13/15-11/08/15   PT Start Time 1300   PT Stop Time 1345   PT Time Calculation (min) 45 min   Activity Tolerance Patient tolerated treatment well   Behavior During Therapy [facility] for tasks assessed/performed      Past Medical History  Diagnosis Date  . ADHD (attention deficit hyperactivity disorder)     No past surgical history on file.  There were no vitals filed for this visit.  Visit Diagnosis:  Difficulty walking  Stiffness of left ankle joint  Weakness of left leg  Abnormality of gait  Fracture of left tibia and fibula, open type III, with routine healing, subsequent encounter      Subjective Assessment - 09/26/15 1346    Subjective Pt reports currently without pain but prolonged walking aggrevates it.  Pt is currently walking with CAM boot without AD.            [facility] PT Assessment - 09/26/15 1339    Assessment   Medical Diagnosis L leg fracture   Onset Date/Surgical Date 08/13/15   Next MD Visit 09/27/15   Circumferential Edema   Circumferential - Right 24.5 cm   Circumferential - Left  25.5 cm   AROM   Left Knee Extension 0   Left Knee Flexion 140   Left Ankle Dorsiflexion 3   Left Ankle Plantar Flexion 50   Left Ankle Inversion 24   Left Ankle Eversion 8                     [facility] Adult PT Treatment/Exercise - 09/26/15 1304    Knee/Hip Exercises: Supine   Straight Leg Raises Both;15 reps;2 sets   Straight Leg Raises  Limitations 3#   Knee/Hip Exercises: Sidelying   Hip ABduction Both;15 reps;2 sets   Hip ABduction Limitations 3#   Knee/Hip Exercises: Prone   Hip Extension Both;15 reps;2 sets   Hip Extension Limitations 3#   Manual Therapy   Manual Therapy Soft tissue mobilization   Soft tissue mobilization STM to L FHL,    Ankle Exercises: Seated   ABC's 2 reps   Towel Crunch 3 reps   BAPS Level 2;Sitting;15 reps   Ankle Exercises: Supine   T-Band green tband 4-way                  PT Short Term Goals - 09/26/15 1344    PT SHORT TERM GOAL #1   Title Pt will be independent with HEP.    Time 3   Period Weeks   Status Achieved   PT SHORT TERM GOAL #2   Title Improve left ankle rom by 10 degrees in all planes to improve mobility and gait mechanics.    Time 3   Period Weeks   Status Partially Met   PT SHORT TERM GOAL #3   Title Improve ankle strength to 4/5 to improve gait mechanics and ankle stability.    Time 3   Period  Weeks   Status On-going   PT SHORT TERM GOAL #4   Title Pt will ambulate 150 feet in fracture boot with LRAD or no AD.   Time 3   Period Weeks   Status Achieved           PT Long Term Goals - 09/26/15 1345    PT LONG TERM GOAL #1   Title Pt will be independent with advanced HEP for ankle ROM, strengthening, and proprioception.   Time 6   Period Weeks   Status On-going   PT LONG TERM GOAL #2   Title Improve ankle ROM by 15 degrees in all planes to improve mobility and normalize gait mechanics.   Time 6   Period Weeks   Status On-going   PT LONG TERM GOAL #3   Title Improve left hip and ankle strength to 4+/5 or greater to improve gait mechanics.   Time 6   Period Weeks   Status On-going   PT LONG TERM GOAL #4   Title Decrease edema in L ankle evidenced by equal circumfrential measurements at superior aspect of malleoli.   Time 6   Period Weeks   Status On-going   PT LONG TERM GOAL #5   Title Pt will ambulate 1,000 feet with no AD and with  proper gait mechanics to return to PLOF of community ambulation.    Time 6   Period Weeks   Status On-going   PT LONG TERM GOAL #6   Title Pt will maintain SLS on LLE for 60 seconds or greater to demonstrate good balance and proprioception in LLE.    Time 6   Period Weeks   Status On-going               Plan - 09/26/15 1342    Clinical Impression Statement Overall improvment in ankle mobility today with most diffiuclty going into ankle eversion due to weakness and immobility.   Reduced edema Lt ankle with improving gait quality.  Pt has met 2/4 STG's and progressing towards LTG's.   PT eager to begin ambulation without wearing boot.    PT Next Visit Plan Continue with ankle mobility/strengthening and hip/knee strengthening. Progress to full weight bearing actvities pending full healing of LE.         Problem List Patient Active Problem List   Diagnosis Date Noted  . SHOULDER DISLOCATION-RECURRENT 11/06/2007    Teena Irani, PTA/CLT [phone] 09/26/2015, 1:53 PM  [place] [address] [place], [place], [address] Phone: [phone]   Fax:  [phone]

## 2015-09-28 ENCOUNTER — Ambulatory Visit (HOSPITAL_COMMUNITY): Payer: BLUE CROSS/BLUE SHIELD | Admitting: Physical Therapy

## 2015-09-28 DIAGNOSIS — R262 Difficulty in walking, not elsewhere classified: Secondary | ICD-10-CM

## 2015-09-28 DIAGNOSIS — R29898 Other symptoms and signs involving the musculoskeletal system: Secondary | ICD-10-CM

## 2015-09-28 DIAGNOSIS — M25672 Stiffness of left ankle, not elsewhere classified: Secondary | ICD-10-CM

## 2015-09-28 DIAGNOSIS — R269 Unspecified abnormalities of gait and mobility: Secondary | ICD-10-CM

## 2015-09-28 NOTE — Therapy (Signed)
[facility] [address] [place], [place], [address] Phone: [phone]   Fax:  [phone]  Physical Therapy Treatment  Patient Details  Name: Peter Yu MRN: [id number] Date of Birth: 09-20-1991 Referring Provider:  Kevan Rosebush Tyler-Par*  Encounter Date: 09/28/2015      PT End of Session - 09/28/15 1158    Visit Number 7   Number of Visits 18   Date for PT Re-Evaluation 10/13/15   Authorization Type BCBS- 50 visit limit   Authorization Time Period 09/13/15-11/08/15   PT Start Time 1100   PT Stop Time 1149   PT Time Calculation (min) 49 min   Activity Tolerance Patient tolerated treatment well   Behavior During Therapy [facility] for tasks assessed/performed      Past Medical History  Diagnosis Date  . ADHD (attention deficit hyperactivity disorder)     No past surgical history on file.  There were no vitals filed for this visit.  Visit Diagnosis:  Difficulty walking  Stiffness of left ankle joint  Weakness of left leg  Abnormality of gait      Subjective Assessment - 09/28/15 1109    Subjective Pt saw his MD yesterday, who discharged his boot for walking short distances. Pt is cleared to return to work on 10/31 as long as he does not walk farther than 50 feet or climb.    Currently in Pain? No/denies   Pain Score 0-No pain              [facility] Adult PT Treatment/Exercise - 09/28/15 0001    Knee/Hip Exercises: Standing   Forward Lunges 10 reps   Forward Lunges Limitations 6" step   Side Lunges 10 reps   Side Lunges Limitations 6" step   Manual Therapy   Manual Therapy Soft tissue mobilization   Soft tissue mobilization STM to L peroneals, gastroc, soleus   Ankle Exercises: Standing   BAPS Level 2;10 reps   Rocker Board 2 minutes   Heel Raises 15 reps             Balance Exercises - 09/28/15 1155    Balance Exercises: Standing   Tandem Stance Foam/compliant surface;2 reps;30 secs   SLS Eyes  open;Solid surface;Foam/compliant surface;3 reps  35" max on solid ground, 22" max on foam             PT Short Term Goals - 09/26/15 1344    PT SHORT TERM GOAL #1   Title Pt will be independent with HEP.    Time 3   Period Weeks   Status Achieved   PT SHORT TERM GOAL #2   Title Improve left ankle rom by 10 degrees in all planes to improve mobility and gait mechanics.    Time 3   Period Weeks   Status Partially Met   PT SHORT TERM GOAL #3   Title Improve ankle strength to 4/5 to improve gait mechanics and ankle stability.    Time 3   Period Weeks   Status On-going   PT SHORT TERM GOAL #4   Title Pt will ambulate 150 feet in fracture boot with LRAD or no AD.   Time 3   Period Weeks   Status Achieved           PT Long Term Goals - 09/26/15 1345    PT LONG TERM GOAL #1   Title Pt will be independent with advanced HEP for ankle ROM, strengthening, and proprioception.  Time 6   Period Weeks   Status On-going   PT LONG TERM GOAL #2   Title Improve ankle ROM by 15 degrees in all planes to improve mobility and normalize gait mechanics.   Time 6   Period Weeks   Status On-going   PT LONG TERM GOAL #3   Title Improve left hip and ankle strength to 4+/5 or greater to improve gait mechanics.   Time 6   Period Weeks   Status On-going   PT LONG TERM GOAL #4   Title Decrease edema in L ankle evidenced by equal circumfrential measurements at superior aspect of malleoli.   Time 6   Period Weeks   Status On-going   PT LONG TERM GOAL #5   Title Pt will ambulate 1,000 feet with no AD and with proper gait mechanics to return to PLOF of community ambulation.    Time 6   Period Weeks   Status On-going   PT LONG TERM GOAL #6   Title Pt will maintain SLS on LLE for 60 seconds or greater to demonstrate good balance and proprioception in LLE.    Time 6   Period Weeks   Status On-going               Plan - 09/28/15 1159    Clinical Impression Statement Treatment  session focused on standing ankle mobility and stablilization exercises to improve ability to ambulate with proper mechanics without boot. Pt required verbal and tactile cueing for proper form with BAPS board to isolate ankle movement. Forward and side lunges were added for LE strengthening and stability, pt was able to complete without any pain. Pt denied any increased pain post treatment   PT Next Visit Plan Continue standing proprioception and stability exercises        Problem List Patient Active Problem List   Diagnosis Date Noted  . SHOULDER DISLOCATION-RECURRENT 11/06/2007    Hilma Favors, PT, DPT [phone] 09/28/2015, 12:13 PM  [facility] [address] [place], [place], [address] Phone: [phone]   Fax:  [phone]

## 2015-09-30 ENCOUNTER — Ambulatory Visit (HOSPITAL_COMMUNITY): Payer: BLUE CROSS/BLUE SHIELD | Admitting: Physical Therapy

## 2015-09-30 DIAGNOSIS — R29898 Other symptoms and signs involving the musculoskeletal system: Secondary | ICD-10-CM

## 2015-09-30 DIAGNOSIS — R269 Unspecified abnormalities of gait and mobility: Secondary | ICD-10-CM

## 2015-09-30 DIAGNOSIS — M25672 Stiffness of left ankle, not elsewhere classified: Secondary | ICD-10-CM

## 2015-09-30 DIAGNOSIS — R262 Difficulty in walking, not elsewhere classified: Secondary | ICD-10-CM

## 2015-09-30 NOTE — Therapy (Signed)
[place], [place], [address] Phone: [phone]   Fax:  [phone]  Physical Therapy Treatment  Patient Details  Name: Peter Yu MRN: [id number] Date of Birth: 1991/06/30 No Data Recorded  Encounter Date: 09/30/2015      PT End of Session - 09/30/15 1116    Visit Number 8   Number of Visits 18   Date for PT Re-Evaluation 10/13/15   Authorization Type BCBS- 50 visit limit   Authorization Time Period 09/13/15-11/08/15   PT Start Time 1019   PT Stop Time 1102   PT Time Calculation (min) 43 min   Activity Tolerance Patient tolerated treatment well   Behavior During Therapy [facility] for tasks assessed/performed      Past Medical History  Diagnosis Date  . ADHD (attention deficit hyperactivity disorder)     No past surgical history on file.  There were no vitals filed for this visit.  Visit Diagnosis:  Difficulty walking  Stiffness of left ankle joint  Weakness of left leg  Abnormality of gait      Subjective Assessment - 09/30/15 1020    Subjective Pt reports that he had a lot of soreness after last treatment.    Currently in Pain? No/denies   Pain Score 0-No pain                         [facility] Adult PT Treatment/Exercise - 09/30/15 0001    Ambulation/Gait   Ambulation/Gait Yes   Ambulation/Gait Assistance 7: Independent   Ambulation Distance (Feet) 450 Feet   Gait Pattern Decreased step length - right;Decreased stance time - left;Decreased weight shift to left   Gait Comments cueing for equal step length, heel strike   Manual Therapy   Manual Therapy Soft tissue mobilization   Soft tissue mobilization STM to L peroneals, gastroc, soleus   Ankle Exercises: Standing   BAPS Level 2;10 reps   Rocker Board 2 minutes   Heel Raises 15 reps   Toe Raise 15 reps   Ankle Exercises: Seated   ABC's 2 reps   Towel Crunch 3 reps   Towel Inversion/Eversion --  15 reps                 PT Education - 09/30/15 1116    Education provided Yes   Education Details Educated on limiting walking per MD to avoid reinjury   Person(s) Educated Patient   Methods Explanation   Comprehension Verbalized understanding          PT Short Term Goals - 09/26/15 1344    PT SHORT TERM GOAL #1   Title Pt will be independent with HEP.    Time 3   Period Weeks   Status Achieved   PT SHORT TERM GOAL #2   Title Improve left ankle rom by 10 degrees in all planes to improve mobility and gait mechanics.    Time 3   Period Weeks   Status Partially Met   PT SHORT TERM GOAL #3   Title Improve ankle strength to 4/5 to improve gait mechanics and ankle stability.    Time 3   Period Weeks   Status On-going   PT SHORT TERM GOAL #4   Title Pt will ambulate 150 feet in fracture boot with LRAD or no AD.   Time 3   Period Weeks   Status Achieved  PT Long Term Goals - 09/26/15 1345    PT LONG TERM GOAL #1   Title Pt will be independent with advanced HEP for ankle ROM, strengthening, and proprioception.   Time 6   Period Weeks   Status On-going   PT LONG TERM GOAL #2   Title Improve ankle ROM by 15 degrees in all planes to improve mobility and normalize gait mechanics.   Time 6   Period Weeks   Status On-going   PT LONG TERM GOAL #3   Title Improve left hip and ankle strength to 4+/5 or greater to improve gait mechanics.   Time 6   Period Weeks   Status On-going   PT LONG TERM GOAL #4   Title Decrease edema in L ankle evidenced by equal circumfrential measurements at superior aspect of malleoli.   Time 6   Period Weeks   Status On-going   PT LONG TERM GOAL #5   Title Pt will ambulate 1,000 feet with no AD and with proper gait mechanics to return to PLOF of community ambulation.    Time 6   Period Weeks   Status On-going   PT LONG TERM GOAL #6   Title Pt will maintain SLS on LLE for 60 seconds or greater to demonstrate good balance and  proprioception in LLE.    Time 6   Period Weeks   Status On-going               Plan - 09/30/15 1117    Clinical Impression Statement Continued focus on ankle mobility, standing strengthening, and gait trainnig today. Pt continues to have the most difficulty with eversion, requiring cues to avoid compensation with knee movement. Gait training was completed today, pt required cueing for equal step length and for heel-toe gait pattern. Pt educated on continuing to limit community ambulation unless using boot or crutch per MD orders   PT Next Visit Plan Continue with mobility and proprioceptive exercises, gait training, and functional strengthening        Problem List Patient Active Problem List   Diagnosis Date Noted  . SHOULDER DISLOCATION-RECURRENT 11/06/2007    Hilma Favors, PT, DPT [phone] 09/30/2015, 11:32 AM  [place] [address] [place], [place], [address] Phone: [phone]   Fax:  [phone]  Name: Peter Yu MRN: [id number] Date of Birth: 29-Oct-1991

## 2015-10-03 ENCOUNTER — Ambulatory Visit (HOSPITAL_COMMUNITY): Payer: BLUE CROSS/BLUE SHIELD | Admitting: Physical Therapy

## 2015-10-03 DIAGNOSIS — R29898 Other symptoms and signs involving the musculoskeletal system: Secondary | ICD-10-CM

## 2015-10-03 DIAGNOSIS — S82402F Unspecified fracture of shaft of left fibula, subsequent encounter for open fracture type IIIA, IIIB, or IIIC with routine healing: Secondary | ICD-10-CM

## 2015-10-03 DIAGNOSIS — R269 Unspecified abnormalities of gait and mobility: Secondary | ICD-10-CM

## 2015-10-03 DIAGNOSIS — S82202F Unspecified fracture of shaft of left tibia, subsequent encounter for open fracture type IIIA, IIIB, or IIIC with routine healing: Secondary | ICD-10-CM

## 2015-10-03 DIAGNOSIS — R262 Difficulty in walking, not elsewhere classified: Secondary | ICD-10-CM

## 2015-10-03 DIAGNOSIS — M25672 Stiffness of left ankle, not elsewhere classified: Secondary | ICD-10-CM | POA: Diagnosis not present

## 2015-10-03 NOTE — Therapy (Signed)
[place], [place], [address] Phone: [phone]   Fax:  [phone]  Physical Therapy Treatment  Patient Details  Name: Peter Yu MRN: [id number] Date of Birth: [birth year] No Data Recorded  Encounter Date: 10/03/2015      PT End of Session - 10/03/15 1203    Visit Number 9   Number of Visits 18   Date for PT Re-Evaluation 10/13/15   Authorization Type BCBS- 50 visit limit   Authorization Time Period 09/13/15-11/08/15   PT Start Time 1100   PT Stop Time 1150   PT Time Calculation (min) 50 min   Activity Tolerance Patient tolerated treatment well   Behavior During Therapy [facility] for tasks assessed/performed      Past Medical History  Diagnosis Date  . ADHD (attention deficit hyperactivity disorder)     No past surgical history on file.  There were no vitals filed for this visit.  Visit Diagnosis:  Difficulty walking  Stiffness of left ankle joint  Weakness of left leg  Abnormality of gait  Fracture of left tibia and fibula, open type III, with routine healing, subsequent encounter      Subjective Assessment - 10/03/15 1209    Subjective Pt comes in today with increased antalgia stating he walked too much over the weekend.  Most discomfort is distal lateral Lt LE (peroneal region).  No pain number given, just soreness.    Currently in Pain? No/denies                         [facility] Adult PT Treatment/Exercise - 10/03/15 0001    Knee/Hip Exercises: Stretches   Gastroc Stretch 3 reps;30 seconds   Gastroc Stretch Limitations slant board   Knee/Hip Exercises: Standing   Forward Lunges 10 reps   Forward Lunges Limitations 6" step   Side Lunges 10 reps   Side Lunges Limitations 6" step   Functional Squat 10 reps   SLS with Vectors 10X5" Lt LE with 1 HHA   Manual Therapy   Manual Therapy Soft tissue mobilization   Soft tissue mobilization STM to L peroneals, gastroc, soleus    Ankle Exercises: Standing   BAPS Level 2;10 reps   Rocker Board 2 minutes   Heel Raises 15 reps   Toe Raise 15 reps                  PT Short Term Goals - 09/26/15 1344    PT SHORT TERM GOAL #1   Title Pt will be independent with HEP.    Time 3   Period Weeks   Status Achieved   PT SHORT TERM GOAL #2   Title Improve left ankle rom by 10 degrees in all planes to improve mobility and gait mechanics.    Time 3   Period Weeks   Status Partially Met   PT SHORT TERM GOAL #3   Title Improve ankle strength to 4/5 to improve gait mechanics and ankle stability.    Time 3   Period Weeks   Status On-going   PT SHORT TERM GOAL #4   Title Pt will ambulate 150 feet in fracture boot with LRAD or no AD.   Time 3   Period Weeks   Status Achieved           PT Long Term Goals - 09/26/15 1345    PT LONG TERM GOAL #1   Title Pt will be  independent with advanced HEP for ankle ROM, strengthening, and proprioception.   Time 6   Period Weeks   Status On-going   PT LONG TERM GOAL #2   Title Improve ankle ROM by 15 degrees in all planes to improve mobility and normalize gait mechanics.   Time 6   Period Weeks   Status On-going   PT LONG TERM GOAL #3   Title Improve left hip and ankle strength to 4+/5 or greater to improve gait mechanics.   Time 6   Period Weeks   Status On-going   PT LONG TERM GOAL #4   Title Decrease edema in L ankle evidenced by equal circumfrential measurements at superior aspect of malleoli.   Time 6   Period Weeks   Status On-going   PT LONG TERM GOAL #5   Title Pt will ambulate 1,000 feet with no AD and with proper gait mechanics to return to PLOF of community ambulation.    Time 6   Period Weeks   Status On-going   PT LONG TERM GOAL #6   Title Pt will maintain SLS on LLE for 60 seconds or greater to demonstrate good balance and proprioception in LLE.    Time 6   Period Weeks   Status On-going               Plan - 10/03/15 1204     Clinical Impression Statement Overall improving ankle mobility, however limited by lateral LE pain.  Advised patient to either use boot or 1 crutch when ambulating longer distances.  Added squats and vector stances today without diffiuculty, however noted Lt LE weakness.  Continued wtih manual focusing mainly on peroneal and calf musculature.  Pt tender in this area as well.     PT Next Visit Plan Continue with mobility and proprioceptive exercises, gait training, and functional strengthening.  Add wallsits for 10-15" next session        Problem List Patient Active Problem List   Diagnosis Date Noted  . SHOULDER DISLOCATION-RECURRENT 11/06/2007    Teena Irani, PTA/CLT [phone]  10/03/2015, 12:11 PM  [place] [address] [place], [place], [address] Phone: [phone]   Fax:  [phone]  Name: LAMINE LATON MRN: [id number] Date of Birth: 1991/06/30

## 2015-10-05 ENCOUNTER — Ambulatory Visit (HOSPITAL_COMMUNITY): Payer: BLUE CROSS/BLUE SHIELD | Admitting: Physical Therapy

## 2015-10-05 DIAGNOSIS — M25672 Stiffness of left ankle, not elsewhere classified: Secondary | ICD-10-CM | POA: Diagnosis not present

## 2015-10-05 DIAGNOSIS — R29898 Other symptoms and signs involving the musculoskeletal system: Secondary | ICD-10-CM

## 2015-10-05 DIAGNOSIS — S82402F Unspecified fracture of shaft of left fibula, subsequent encounter for open fracture type IIIA, IIIB, or IIIC with routine healing: Secondary | ICD-10-CM

## 2015-10-05 DIAGNOSIS — S82202F Unspecified fracture of shaft of left tibia, subsequent encounter for open fracture type IIIA, IIIB, or IIIC with routine healing: Secondary | ICD-10-CM

## 2015-10-05 DIAGNOSIS — R269 Unspecified abnormalities of gait and mobility: Secondary | ICD-10-CM

## 2015-10-05 DIAGNOSIS — R262 Difficulty in walking, not elsewhere classified: Secondary | ICD-10-CM

## 2015-10-05 NOTE — Therapy (Signed)
[place], [place], [address] Phone: [phone]   Fax:  [phone]  Physical Therapy Treatment  Patient Details  Name: Peter Yu MRN: [id number] Date of Birth: Jun 21, 1991 No Data Recorded  Encounter Date: 10/05/2015      PT End of Session - 10/05/15 1250    Visit Number 10   Number of Visits 18   Date for PT Re-Evaluation 10/13/15   Authorization Type BCBS- 50 visit limit   Authorization Time Period 09/13/15-11/08/15   PT Start Time 1106   PT Stop Time 1150   PT Time Calculation (min) 44 min   Activity Tolerance Patient tolerated treatment well   Behavior During Therapy [facility] for tasks assessed/performed      Past Medical History  Diagnosis Date  . ADHD (attention deficit hyperactivity disorder)     No past surgical history on file.  There were no vitals filed for this visit.  Visit Diagnosis:  Difficulty walking  Stiffness of left ankle joint  Weakness of left leg  Abnormality of gait  Fracture of left tibia and fibula, open type III, with routine healing, subsequent encounter      Subjective Assessment - 10/05/15 1253    Subjective Pt states it is not hurting as bad today as it did last visit.  Still with some discomfort but very minimal.    Currently in Pain? Yes   Pain Score 2    Pain Location Leg   Pain Orientation Left   Pain Descriptors / Indicators Shooting;Tender;Tightness                         [facility] Adult PT Treatment/Exercise - 10/05/15 1109    Knee/Hip Exercises: Stretches   Gastroc Stretch 3 reps;30 seconds   Gastroc Stretch Limitations slant board   Knee/Hip Exercises: Standing   Heel Raises Limitations heelwalk, toewalk 1RT   Forward Lunges 10 reps   Forward Lunges Limitations 4" step   Side Lunges 10 reps   Side Lunges Limitations 4" step   Functional Squat 10 reps   Wall Squat 5 reps;5 seconds   SLS with Vectors 10X5" Lt LE with 1 HHA   Manual Therapy   Manual Therapy Soft tissue mobilization   Soft tissue mobilization STM to L peroneals, gastroc, soleus   Ankle Exercises: Standing   BAPS Level 3;Standing;10 reps                  PT Short Term Goals - 09/26/15 1344    PT SHORT TERM GOAL #1   Title Pt will be independent with HEP.    Time 3   Period Weeks   Status Achieved   PT SHORT TERM GOAL #2   Title Improve left ankle rom by 10 degrees in all planes to improve mobility and gait mechanics.    Time 3   Period Weeks   Status Partially Met   PT SHORT TERM GOAL #3   Title Improve ankle strength to 4/5 to improve gait mechanics and ankle stability.    Time 3   Period Weeks   Status On-going   PT SHORT TERM GOAL #4   Title Pt will ambulate 150 feet in fracture boot with LRAD or no AD.   Time 3   Period Weeks   Status Achieved           PT Long Term Goals - 09/26/15 1345    PT LONG  TERM GOAL #1   Title Pt will be independent with advanced HEP for ankle ROM, strengthening, and proprioception.   Time 6   Period Weeks   Status On-going   PT LONG TERM GOAL #2   Title Improve ankle ROM by 15 degrees in all planes to improve mobility and normalize gait mechanics.   Time 6   Period Weeks   Status On-going   PT LONG TERM GOAL #3   Title Improve left hip and ankle strength to 4+/5 or greater to improve gait mechanics.   Time 6   Period Weeks   Status On-going   PT LONG TERM GOAL #4   Title Decrease edema in L ankle evidenced by equal circumfrential measurements at superior aspect of malleoli.   Time 6   Period Weeks   Status On-going   PT LONG TERM GOAL #5   Title Pt will ambulate 1,000 feet with no AD and with proper gait mechanics to return to PLOF of community ambulation.    Time 6   Period Weeks   Status On-going   PT LONG TERM GOAL #6   Title Pt will maintain SLS on LLE for 60 seconds or greater to demonstrate good balance and proprioception in LLE.    Time 6   Period Weeks   Status  On-going               Plan - 10/05/15 1251    Clinical Impression Statement Noted improvement in gait upon entering clinic with less pain and soreness reported than previous visit.  Progressed to heel/toe walking actvitiy and increased to level 3 on BAPS.  Added wallsits with inablity to sit longer than 5" due to weakness.  Pt reported discomfort with toe walking actvity but pain reduced at end of session following manual techniques.     PT Next Visit Plan Continue with mobility and proprioceptive exercises, gait training, and functional strengthening.  Increase wallsit time to 10" if able  next session        Problem List Patient Active Problem List   Diagnosis Date Noted  . SHOULDER DISLOCATION-RECURRENT 11/06/2007    Teena Irani, PTA/CLT [phone]  10/05/2015, 12:55 PM  [place] [address] [place], [place], [address] Phone: [phone]   Fax:  [phone]  Name: Peter Yu MRN: [id number] Date of Birth: June 12, 1991

## 2015-10-07 ENCOUNTER — Ambulatory Visit (HOSPITAL_COMMUNITY): Payer: BLUE CROSS/BLUE SHIELD | Admitting: Physical Therapy

## 2015-10-07 DIAGNOSIS — M25672 Stiffness of left ankle, not elsewhere classified: Secondary | ICD-10-CM | POA: Diagnosis not present

## 2015-10-07 DIAGNOSIS — R29898 Other symptoms and signs involving the musculoskeletal system: Secondary | ICD-10-CM

## 2015-10-07 DIAGNOSIS — R269 Unspecified abnormalities of gait and mobility: Secondary | ICD-10-CM

## 2015-10-07 DIAGNOSIS — R262 Difficulty in walking, not elsewhere classified: Secondary | ICD-10-CM

## 2015-10-07 NOTE — Therapy (Signed)
[place], [place], [address] Phone: [phone]   Fax:  [phone]  Physical Therapy Treatment  Patient Details  Name: Peter Yu MRN: [id number] Date of Birth: [birth year] No Data Recorded  Encounter Date: 10/07/2015      PT End of Session - 10/07/15 1218    Visit Number 11   Number of Visits 18   Date for PT Re-Evaluation 10/13/15   Authorization Type BCBS- 50 visit limit   Authorization Time Period 09/13/15-11/08/15   PT Start Time 1104   PT Stop Time 1150   PT Time Calculation (min) 46 min   Activity Tolerance Patient tolerated treatment well   Behavior During Therapy [facility] for tasks assessed/performed      Past Medical History  Diagnosis Date  . ADHD (attention deficit hyperactivity disorder)     No past surgical history on file.  There were no vitals filed for this visit.  Visit Diagnosis:  Difficulty walking  Stiffness of left ankle joint  Weakness of left leg  Abnormality of gait      Subjective Assessment - 10/07/15 1108    Subjective Pt reports that he switched pain medication and it isn't really helping.    Currently in Pain? Yes   Pain Score 2    Pain Location Leg   Pain Orientation Left                  [facility] Adult PT Treatment/Exercise - 10/07/15 0001    Knee/Hip Exercises: Standing   Gait Training 226' x 2, 1 pre-manual therapy, 1 post manual therapy   Manual Therapy   Manual Therapy Myofascial release   Myofascial Release to L posterior tibials, peroneals   Ankle Exercises: Seated   ABC's 2 reps   Ankle Exercises: Standing   BAPS Level 3;Standing;10 reps   Rocker Board 2 minutes                  PT Short Term Goals - 09/26/15 1344    PT SHORT TERM GOAL #1   Title Pt will be independent with HEP.    Time 3   Period Weeks   Status Achieved   PT SHORT TERM GOAL #2   Title Improve left ankle rom by 10 degrees in all planes to improve  mobility and gait mechanics.    Time 3   Period Weeks   Status Partially Met   PT SHORT TERM GOAL #3   Title Improve ankle strength to 4/5 to improve gait mechanics and ankle stability.    Time 3   Period Weeks   Status On-going   PT SHORT TERM GOAL #4   Title Pt will ambulate 150 feet in fracture boot with LRAD or no AD.   Time 3   Period Weeks   Status Achieved           PT Long Term Goals - 09/26/15 1345    PT LONG TERM GOAL #1   Title Pt will be independent with advanced HEP for ankle ROM, strengthening, and proprioception.   Time 6   Period Weeks   Status On-going   PT LONG TERM GOAL #2   Title Improve ankle ROM by 15 degrees in all planes to improve mobility and normalize gait mechanics.   Time 6   Period Weeks   Status On-going   PT LONG TERM GOAL #3   Title Improve left hip and ankle strength to 4+/5 or  greater to improve gait mechanics.   Time 6   Period Weeks   Status On-going   PT LONG TERM GOAL #4   Title Decrease edema in L ankle evidenced by equal circumfrential measurements at superior aspect of malleoli.   Time 6   Period Weeks   Status On-going   PT LONG TERM GOAL #5   Title Pt will ambulate 1,000 feet with no AD and with proper gait mechanics to return to PLOF of community ambulation.    Time 6   Period Weeks   Status On-going   PT LONG TERM GOAL #6   Title Pt will maintain SLS on LLE for 60 seconds or greater to demonstrate good balance and proprioception in LLE.    Time 6   Period Weeks   Status On-going               Plan - 10/07/15 1218    Clinical Impression Statement Pt presented with reports of increased pain when walking. Seated and standing ankle mobility exercises were completed, during which pt denied increased pain. Pt demonstrated antalgic gait, reporting pain in his medial shin region. Manual therapy was performed to LLE, consisting of myofascial release to posterior tibialis and peroneus longus muscles. Following manual  therapy, pt was able to ambulate with improved weight bearing on LLE with reports of decreased pain.    PT Next Visit Plan Manual PRN. Continue with mobility and proprioceptive exercises, gait training, and functional strengthening.  Increase wallsit time to 10" if able  next session        Problem List Patient Active Problem List   Diagnosis Date Noted  . SHOULDER DISLOCATION-RECURRENT 11/06/2007    Hilma Favors, PT, DPT [phone] 10/07/2015, 12:21 PM  [place] [address] [place], [place], [address] Phone: [phone]   Fax:  [phone]  Name: Peter Yu MRN: [id number] Date of Birth: 02/16/1991

## 2015-10-10 ENCOUNTER — Ambulatory Visit (HOSPITAL_COMMUNITY): Payer: BLUE CROSS/BLUE SHIELD | Admitting: Physical Therapy

## 2015-10-10 DIAGNOSIS — R262 Difficulty in walking, not elsewhere classified: Secondary | ICD-10-CM

## 2015-10-10 DIAGNOSIS — M25672 Stiffness of left ankle, not elsewhere classified: Secondary | ICD-10-CM

## 2015-10-10 DIAGNOSIS — S82202F Unspecified fracture of shaft of left tibia, subsequent encounter for open fracture type IIIA, IIIB, or IIIC with routine healing: Secondary | ICD-10-CM

## 2015-10-10 DIAGNOSIS — R29898 Other symptoms and signs involving the musculoskeletal system: Secondary | ICD-10-CM

## 2015-10-10 DIAGNOSIS — S82402F Unspecified fracture of shaft of left fibula, subsequent encounter for open fracture type IIIA, IIIB, or IIIC with routine healing: Secondary | ICD-10-CM

## 2015-10-10 DIAGNOSIS — R269 Unspecified abnormalities of gait and mobility: Secondary | ICD-10-CM

## 2015-10-10 NOTE — Therapy (Signed)
[facility] [address] [place], [place], [address] Phone: [phone]   Fax:  [phone]  Physical Therapy Treatment  Patient Details  Name: Peter Yu MRN: [id number] Date of Birth: [birth year] No Data Recorded  Encounter Date: 10/10/2015      PT End of Session - 10/10/15 1638    Visit Number 12   Number of Visits 18   Date for PT Re-Evaluation 10/13/15   Authorization Type BCBS- 50 visit limit   Authorization Time Period 09/13/15-11/08/15   PT Start Time 1110   PT Stop Time 1150   PT Time Calculation (min) 40 min   Activity Tolerance Patient tolerated treatment well   Behavior During Therapy [facility] for tasks assessed/performed      Past Medical History  Diagnosis Date  . ADHD (attention deficit hyperactivity disorder)     No past surgical history on file.  There were no vitals filed for this visit.  Visit Diagnosis:  Difficulty walking  Stiffness of left ankle joint  Weakness of left leg  Abnormality of gait  Fracture of left tibia and fibula, open type III, with routine healing, subsequent encounter      Subjective Assessment - 10/10/15 1634    Subjective Pt continues to have swelling distal LE into ankle.  States sharp stabbing pains when walking and clicking with movement at times.     Currently in Pain? Yes   Pain Score 3    Pain Location Leg   Pain Orientation Left   Pain Descriptors / Indicators Shooting;Jabbing;Tender;Tightness                         [facility] Adult PT Treatment/Exercise - 10/10/15 1636    Knee/Hip Exercises: Stretches   Gastroc Stretch 3 reps;30 seconds   Gastroc Stretch Limitations slant board   Manual Therapy   Manual Therapy Myofascial release;Soft tissue mobilization   Manual therapy comments supine with elevation   Soft tissue mobilization STM to L peroneals, gastroc, soleus   Myofascial Release to L posterior tibials, peroneals                PT  Education - 10/10/15 1637    Education provided Yes   Education Details Contact MD to check on appt and get into office asap due to symptoms/swelling.  Ambulation with SPC if greater than 50 feet or with CAM walker due to increased symtpoms   Person(s) Educated Patient   Methods Explanation   Comprehension Verbalized understanding          PT Short Term Goals - 09/26/15 1344    PT SHORT TERM GOAL #1   Title Pt will be independent with HEP.    Time 3   Period Weeks   Status Achieved   PT SHORT TERM GOAL #2   Title Improve left ankle rom by 10 degrees in all planes to improve mobility and gait mechanics.    Time 3   Period Weeks   Status Partially Met   PT SHORT TERM GOAL #3   Title Improve ankle strength to 4/5 to improve gait mechanics and ankle stability.    Time 3   Period Weeks   Status On-going   PT SHORT TERM GOAL #4   Title Pt will ambulate 150 feet in fracture boot with LRAD or no AD.   Time 3   Period Weeks   Status Achieved  PT Long Term Goals - 09/26/15 1345    PT LONG TERM GOAL #1   Title Pt will be independent with advanced HEP for ankle ROM, strengthening, and proprioception.   Time 6   Period Weeks   Status On-going   PT LONG TERM GOAL #2   Title Improve ankle ROM by 15 degrees in all planes to improve mobility and normalize gait mechanics.   Time 6   Period Weeks   Status On-going   PT LONG TERM GOAL #3   Title Improve left hip and ankle strength to 4+/5 or greater to improve gait mechanics.   Time 6   Period Weeks   Status On-going   PT LONG TERM GOAL #4   Title Decrease edema in L ankle evidenced by equal circumfrential measurements at superior aspect of malleoli.   Time 6   Period Weeks   Status On-going   PT LONG TERM GOAL #5   Title Pt will ambulate 1,000 feet with no AD and with proper gait mechanics to return to PLOF of community ambulation.    Time 6   Period Weeks   Status On-going   PT LONG TERM GOAL #6   Title Pt will  maintain SLS on LLE for 60 seconds or greater to demonstrate good balance and proprioception in LLE.    Time 6   Period Weeks   Status On-going               Plan - 10/10/15 1638    Clinical Impression Statement Continued edema and pain into Lt LE with persistent antalgic gait.  Majority of session spent on manual techniques and patient education.  Stressed importance of returning to MD due to increased symptmos and shooting pains as well as swelling.  Instruced patient to use SPC with ambulation to help decrease WB into LE and decrease pain.  Pt verbalized understanding.  Manual completed to decrease edema and adhesions in Lt LE.  Most sensitive medially and laterally.     PT Next Visit Plan Manual PRN. Continue with mobility and proprioceptive exercises, gait training, and functional strengthening as able due to pain and swelling.  Follow up with next MD appointment.         Problem List Patient Active Problem List   Diagnosis Date Noted  . SHOULDER DISLOCATION-RECURRENT 11/06/2007    Teena Irani, PTA/CLT [phone]  10/10/2015, 4:43 PM  [place] [address] [place], [place], [address] Phone: [phone]   Fax:  [phone]  Name: Peter Yu MRN: [id number] Date of Birth: [birth year]

## 2015-10-12 ENCOUNTER — Ambulatory Visit (HOSPITAL_COMMUNITY): Payer: BLUE CROSS/BLUE SHIELD

## 2015-10-12 DIAGNOSIS — R269 Unspecified abnormalities of gait and mobility: Secondary | ICD-10-CM

## 2015-10-12 DIAGNOSIS — S82202F Unspecified fracture of shaft of left tibia, subsequent encounter for open fracture type IIIA, IIIB, or IIIC with routine healing: Secondary | ICD-10-CM

## 2015-10-12 DIAGNOSIS — M25672 Stiffness of left ankle, not elsewhere classified: Secondary | ICD-10-CM

## 2015-10-12 DIAGNOSIS — R29898 Other symptoms and signs involving the musculoskeletal system: Secondary | ICD-10-CM

## 2015-10-12 DIAGNOSIS — S82402F Unspecified fracture of shaft of left fibula, subsequent encounter for open fracture type IIIA, IIIB, or IIIC with routine healing: Secondary | ICD-10-CM

## 2015-10-12 DIAGNOSIS — R262 Difficulty in walking, not elsewhere classified: Secondary | ICD-10-CM

## 2015-10-12 NOTE — Therapy (Signed)
[facility] [address] [place], [place], [address] Phone: [phone]   Fax:  [phone]  Physical Therapy Treatment  Patient Details  Name: Peter Yu MRN: [id number] Date of Birth: 10/18/1991 No Data Recorded  Encounter Date: 10/12/2015      PT End of Session - 10/12/15 1154    Visit Number 13   Number of Visits 18   Date for PT Re-Evaluation 10/13/15   Authorization Type BCBS- 50 visit limit   Authorization Time Period 09/13/15-11/08/15   PT Start Time 1105   PT Stop Time 1147   PT Time Calculation (min) 42 min   Activity Tolerance Patient tolerated treatment well   Behavior During Therapy [facility] for tasks assessed/performed      Past Medical History  Diagnosis Date  . ADHD (attention deficit hyperactivity disorder)     No past surgical history on file.  There were no vitals filed for this visit.  Visit Diagnosis:  Difficulty walking  Stiffness of left ankle joint  Weakness of left leg  Abnormality of gait  Fracture of left tibia and fibula, open type III, with routine healing, subsequent encounter      Subjective Assessment - 10/12/15 1113    Subjective Pt continues to have swelling and knots on calf muscles.  Current pain scale 3/10 sharp and tenderness increase with gait   Currently in Pain? Yes   Pain Score 3    Pain Location Leg   Pain Orientation Left   Pain Descriptors / Indicators Sharp;Shooting;Tender;Tightness           [facility] Adult PT Treatment/Exercise - 10/12/15 0001    Knee/Hip Exercises: Stretches   Gastroc Stretch 3 reps;30 seconds   Gastroc Stretch Limitations slant board   Manual Therapy   Manual Therapy Edema management;Soft tissue mobilization;Myofascial release   Edema Management Retro massage supine with elevation   Soft tissue mobilization STM to L peroneals, gastroc, soleus   Myofascial Release to L posterior tibials, peroneals   Ankle Exercises: Standing   BAPS Level  3;Standing;10 reps   Rocker Board 2 minutes            PT Short Term Goals - 09/26/15 1344    PT SHORT TERM GOAL #1   Title Pt will be independent with HEP.    Time 3   Period Weeks   Status Achieved   PT SHORT TERM GOAL #2   Title Improve left ankle rom by 10 degrees in all planes to improve mobility and gait mechanics.    Time 3   Period Weeks   Status Partially Met   PT SHORT TERM GOAL #3   Title Improve ankle strength to 4/5 to improve gait mechanics and ankle stability.    Time 3   Period Weeks   Status On-going   PT SHORT TERM GOAL #4   Title Pt will ambulate 150 feet in fracture boot with LRAD or no AD.   Time 3   Period Weeks   Status Achieved           PT Long Term Goals - 09/26/15 1345    PT LONG TERM GOAL #1   Title Pt will be independent with advanced HEP for ankle ROM, strengthening, and proprioception.   Time 6   Period Weeks   Status On-going   PT LONG TERM GOAL #2   Title Improve ankle ROM by 15 degrees in all planes to improve mobility and normalize gait mechanics.     Time 6   Period Weeks   Status On-going   PT LONG TERM GOAL #3   Title Improve left hip and ankle strength to 4+/5 or greater to improve gait mechanics.   Time 6   Period Weeks   Status On-going   PT LONG TERM GOAL #4   Title Decrease edema in L ankle evidenced by equal circumfrential measurements at superior aspect of malleoli.   Time 6   Period Weeks   Status On-going   PT LONG TERM GOAL #5   Title Pt will ambulate 1,000 feet with no AD and with proper gait mechanics to return to PLOF of community ambulation.    Time 6   Period Weeks   Status On-going   PT LONG TERM GOAL #6   Title Pt will maintain SLS on LLE for 60 seconds or greater to demonstrate good balance and proprioception in LLE.    Time 6   Period Weeks   Status On-going               Plan - 10/12/15 1159    Clinical Impression Statement Session focus on improving tolerance with weight bearing  strengthening exercises to improve gait mechanics and improve ROM.  Manual techniques complete to reduce spasms peronals and gastroc musculature and retro massage with LE elevation for edema control.   PT Next Visit Plan Manual PRN. Continue with mobility and proprioceptive exercises, gait training, and functional strengthening as able due to pain and swelling.  Follow up with next MD appointment. Reassess in 2 more sessions prior MD apt.          Problem List Patient Active Problem List   Diagnosis Date Noted  . SHOULDER DISLOCATION-RECURRENT 11/06/2007   Peter Peter Yu, LPTA; CBIS [phone]  Peter Yu, Peter Yu 10/12/2015, 12:05 PM  [facility] [address] [place], [place], [address] Phone: [phone]   Fax:  [phone]  Name: Peter Yu MRN: [id number] Date of Birth: 04/06/1991     

## 2015-10-14 ENCOUNTER — Ambulatory Visit (HOSPITAL_COMMUNITY): Payer: BLUE CROSS/BLUE SHIELD | Admitting: Physical Therapy

## 2015-10-14 DIAGNOSIS — R29898 Other symptoms and signs involving the musculoskeletal system: Secondary | ICD-10-CM

## 2015-10-14 DIAGNOSIS — R262 Difficulty in walking, not elsewhere classified: Secondary | ICD-10-CM

## 2015-10-14 DIAGNOSIS — M25672 Stiffness of left ankle, not elsewhere classified: Secondary | ICD-10-CM | POA: Diagnosis not present

## 2015-10-14 DIAGNOSIS — R269 Unspecified abnormalities of gait and mobility: Secondary | ICD-10-CM

## 2015-10-14 NOTE — Therapy (Signed)
[facility], [place], [address] Phone: [phone]   Fax:  [phone]  Physical Therapy Treatment  Patient Details  Name: Peter Yu MRN: [id number] Date of Birth: [birth year] No Data Recorded  Encounter Date: 10/14/2015      PT End of Session - 10/14/15 1404    Visit Number 14   Number of Visits 18   Date for PT Re-Evaluation 10/17/15   Authorization Type BCBS- 50 visit limit   Authorization Time Period 09/13/15-11/08/15   PT Start Time 1109   PT Stop Time 1149   PT Time Calculation (min) 40 min   Activity Tolerance Patient limited by pain   Behavior During Therapy [facility] for tasks assessed/performed      Past Medical History  Diagnosis Date  . ADHD (attention deficit hyperactivity disorder)     No past surgical history on file.  There were no vitals filed for this visit.  Visit Diagnosis:  Difficulty walking  Stiffness of left ankle joint  Weakness of left leg  Abnormality of gait      Subjective Assessment - 10/14/15 1111    Subjective Pt reports that he didn't have any pain yesterday, and today he has a little more soreness.    Currently in Pain? Yes   Pain Score 2                          [facility] Adult PT Treatment/Exercise - 10/14/15 0001    Knee/Hip Exercises: Stretches   Gastroc Stretch 3 reps;30 seconds   Gastroc Stretch Limitations slant board   Manual Therapy   Edema Management Retro massage supine with elevation   Soft tissue mobilization STM to L peroneals, gastroc, soleus   Myofascial Release to L posterior tibials, peroneals   Ankle Exercises: Standing   BAPS Level 3;Standing;10 reps   Rocker Board 2 minutes   Heel Raises 15 reps   Toe Raise 15 reps   Ankle Exercises: Seated   ABC's 2 reps                PT Education - 10/14/15 1403    Education provided Yes   Education Details Encouraged again to contact MD regarding symptoms. Pt instructed  to use boot or single crutch if he is walking greater than 50 ft.   Person(s) Educated Patient   Methods Explanation   Comprehension Verbalized understanding          PT Short Term Goals - 09/26/15 1344    PT SHORT TERM GOAL #1   Title Pt will be independent with HEP.    Time 3   Period Weeks   Status Achieved   PT SHORT TERM GOAL #2   Title Improve left ankle rom by 10 degrees in all planes to improve mobility and gait mechanics.    Time 3   Period Weeks   Status Partially Met   PT SHORT TERM GOAL #3   Title Improve ankle strength to 4/5 to improve gait mechanics and ankle stability.    Time 3   Period Weeks   Status On-going   PT SHORT TERM GOAL #4   Title Pt will ambulate 150 feet in fracture boot with LRAD or no AD.   Time 3   Period Weeks   Status Achieved           PT Long Term Goals - 09/26/15 1345    PT LONG TERM  GOAL #1   Title Pt will be independent with advanced HEP for ankle ROM, strengthening, and proprioception.   Time 6   Period Weeks   Status On-going   PT LONG TERM GOAL #2   Title Improve ankle ROM by 15 degrees in all planes to improve mobility and normalize gait mechanics.   Time 6   Period Weeks   Status On-going   PT LONG TERM GOAL #3   Title Improve left hip and ankle strength to 4+/5 or greater to improve gait mechanics.   Time 6   Period Weeks   Status On-going   PT LONG TERM GOAL #4   Title Decrease edema in L ankle evidenced by equal circumfrential measurements at superior aspect of malleoli.   Time 6   Period Weeks   Status On-going   PT LONG TERM GOAL #5   Title Pt will ambulate 1,000 feet with no AD and with proper gait mechanics to return to PLOF of community ambulation.    Time 6   Period Weeks   Status On-going   PT LONG TERM GOAL #6   Title Pt will maintain SLS on LLE for 60 seconds or greater to demonstrate good balance and proprioception in LLE.    Time 6   Period Weeks   Status On-going               Plan  - 10/14/15 1405    Clinical Impression Statement Treatment session began with attempted standing strengthening. Pt was unable to tolerate heel raises unsupported or gait training due to increased pain. Pt was able to complete standing mobility exercises without pain. Manual therapy was focused on for latter half of treatment to improve function and mobility of posterior tibialis and peroneals. Pt reported slight decrease in pain following manual therapy, but continues to have pain with weight bearing. Pt encouraged to contact MD and was instructed to use boot or single crutch when ambulating over the weekend.    PT Next Visit Plan Reassess next session prior to MD appt.         Problem List Patient Active Problem List   Diagnosis Date Noted  . SHOULDER DISLOCATION-RECURRENT 11/06/2007    Hilma Favors, PT, DPT [phone] 10/14/2015, 2:08 PM  [facility] [address] [place], [place], [address] Phone: [phone]   Fax:  [phone]  Name: CATRELL MORRONE MRN: [id number] Date of Birth: [birth year]

## 2015-10-17 ENCOUNTER — Ambulatory Visit (HOSPITAL_COMMUNITY): Payer: BLUE CROSS/BLUE SHIELD | Admitting: Physical Therapy

## 2015-10-17 DIAGNOSIS — R262 Difficulty in walking, not elsewhere classified: Secondary | ICD-10-CM

## 2015-10-17 DIAGNOSIS — R29898 Other symptoms and signs involving the musculoskeletal system: Secondary | ICD-10-CM

## 2015-10-17 DIAGNOSIS — S82402F Unspecified fracture of shaft of left fibula, subsequent encounter for open fracture type IIIA, IIIB, or IIIC with routine healing: Secondary | ICD-10-CM

## 2015-10-17 DIAGNOSIS — M25672 Stiffness of left ankle, not elsewhere classified: Secondary | ICD-10-CM

## 2015-10-17 DIAGNOSIS — R269 Unspecified abnormalities of gait and mobility: Secondary | ICD-10-CM

## 2015-10-17 DIAGNOSIS — S82202F Unspecified fracture of shaft of left tibia, subsequent encounter for open fracture type IIIA, IIIB, or IIIC with routine healing: Secondary | ICD-10-CM

## 2015-10-17 NOTE — Therapy (Signed)
[facility], [place], [address] Phone: [phone]   Fax:  [phone]  Physical Therapy Treatment  Patient Details  Name: Peter Yu MRN: [id number] Date of Birth: 04/10/1991 No Data Recorded  Encounter Date: 10/17/2015      PT End of Session - 10/17/15 1200    Visit Number 15   Number of Visits 18   Date for PT Re-Evaluation 10/17/15   Authorization Type BCBS- 50 visit limit   Authorization Time Period 09/13/15-11/08/15   Authorization - Visit Number 15   Authorization - Number of Visits 50   PT Start Time 1100   PT Stop Time 1142   PT Time Calculation (min) 42 min   Activity Tolerance Patient limited by pain   Behavior During Therapy [facility] for tasks assessed/performed      Past Medical History  Diagnosis Date  . ADHD (attention deficit hyperactivity disorder)     No past surgical history on file.  There were no vitals filed for this visit.  Visit Diagnosis:  Difficulty walking  Stiffness of left ankle joint  Weakness of left leg  Abnormality of gait  Fracture of left tibia and fibula, open type III, with routine healing, subsequent encounter      Subjective Assessment - 10/17/15 1156    Subjective Pt continues to complain of pain and soreness in Lt calf region and LE and presents with antalgic gait.    Currently in Pain? Yes   Pain Score 2    Pain Location Leg   Pain Orientation Left   Pain Descriptors / Indicators Sharp;Shooting;Tender                         [facility] Adult PT Treatment/Exercise - 10/17/15 1102    Knee/Hip Exercises: Stretches   Gastroc Stretch 3 reps;30 seconds   Gastroc Stretch Limitations slant board   Manual Therapy   Edema Management Retro massage supine with elevation   Soft tissue mobilization STM to L peroneals, gastroc, soleus   Myofascial Release to L posterior tibials, peroneals   Ankle Exercises: Seated   ABC's 2 reps   Towel  Inversion/Eversion 5 reps                  PT Short Term Goals - 09/26/15 1344    PT SHORT TERM GOAL #1   Title Pt will be independent with HEP.    Time 3   Period Weeks   Status Achieved   PT SHORT TERM GOAL #2   Title Improve left ankle rom by 10 degrees in all planes to improve mobility and gait mechanics.    Time 3   Period Weeks   Status Partially Met   PT SHORT TERM GOAL #3   Title Improve ankle strength to 4/5 to improve gait mechanics and ankle stability.    Time 3   Period Weeks   Status On-going   PT SHORT TERM GOAL #4   Title Pt will ambulate 150 feet in fracture boot with LRAD or no AD.   Time 3   Period Weeks   Status Achieved           PT Long Term Goals - 09/26/15 1345    PT LONG TERM GOAL #1   Title Pt will be independent with advanced HEP for ankle ROM, strengthening, and proprioception.   Time 6   Period Weeks   Status On-going   PT  LONG TERM GOAL #2   Title Improve ankle ROM by 15 degrees in all planes to improve mobility and normalize gait mechanics.   Time 6   Period Weeks   Status On-going   PT LONG TERM GOAL #3   Title Improve left hip and ankle strength to 4+/5 or greater to improve gait mechanics.   Time 6   Period Weeks   Status On-going   PT LONG TERM GOAL #4   Title Decrease edema in L ankle evidenced by equal circumfrential measurements at superior aspect of malleoli.   Time 6   Period Weeks   Status On-going   PT LONG TERM GOAL #5   Title Pt will ambulate 1,000 feet with no AD and with proper gait mechanics to return to PLOF of community ambulation.    Time 6   Period Weeks   Status On-going   PT LONG TERM GOAL #6   Title Pt will maintain SLS on LLE for 60 seconds or greater to demonstrate good balance and proprioception in LLE.    Time 6   Period Weeks   Status On-going               Plan - 10/17/15 1202    Clinical Impression Statement Focused session on seated therex and stretches today.  Therapist was  able to move appointment to tomorrow and patient informed.  Continued concern with amount of pain and swelling at this point. Pt initially with difficulty inverting ankle to  complete towel activity, however able to after getting ankle "warmed up".   PT Next Visit Plan Reassess next session.         Problem List Patient Active Problem List   Diagnosis Date Noted  . SHOULDER DISLOCATION-RECURRENT 11/06/2007   Teena Irani, PTA/CLT [phone] 10/17/2015, 12:09 PM  [facility] [address] [place], [place], [address] Phone: [phone]   Fax:  [phone]  Name: Peter Yu MRN: [id number] Date of Birth: [birth year]

## 2015-10-19 ENCOUNTER — Ambulatory Visit (HOSPITAL_COMMUNITY): Payer: BLUE CROSS/BLUE SHIELD | Attending: Orthopedic Surgery | Admitting: Physical Therapy

## 2015-10-19 DIAGNOSIS — R29898 Other symptoms and signs involving the musculoskeletal system: Secondary | ICD-10-CM | POA: Diagnosis present

## 2015-10-19 DIAGNOSIS — R269 Unspecified abnormalities of gait and mobility: Secondary | ICD-10-CM

## 2015-10-19 DIAGNOSIS — X58XXXD Exposure to other specified factors, subsequent encounter: Secondary | ICD-10-CM | POA: Diagnosis not present

## 2015-10-19 DIAGNOSIS — S82402F Unspecified fracture of shaft of left fibula, subsequent encounter for open fracture type IIIA, IIIB, or IIIC with routine healing: Secondary | ICD-10-CM | POA: Insufficient documentation

## 2015-10-19 DIAGNOSIS — R262 Difficulty in walking, not elsewhere classified: Secondary | ICD-10-CM | POA: Insufficient documentation

## 2015-10-19 DIAGNOSIS — M25672 Stiffness of left ankle, not elsewhere classified: Secondary | ICD-10-CM

## 2015-10-19 DIAGNOSIS — S82202F Unspecified fracture of shaft of left tibia, subsequent encounter for open fracture type IIIA, IIIB, or IIIC with routine healing: Secondary | ICD-10-CM | POA: Insufficient documentation

## 2015-10-19 NOTE — Therapy (Signed)
[place] [facility] [address] [place], [place], [address] Phone: [phone]   Fax:  [phone]  Physical Therapy Treatment  Patient Details  Name: Peter Yu MRN: [id number] Date of Birth: 1991/07/01 No Data Recorded  Encounter Date: 10/19/2015      PT End of Session - 10/19/15 1418    Visit Number 16   Number of Visits 24   Date for PT Re-Evaluation 11/13/15   Authorization Type BCBS- 50 visit limit   Authorization Time Period 09/13/15-11/13/15   Authorization - Visit Number 16   Authorization - Number of Visits 50   PT Start Time 1100   PT Stop Time 1148   PT Time Calculation (min) 48 min   Activity Tolerance Patient tolerated treatment well   Behavior During Therapy [facility] for tasks assessed/performed      Past Medical History  Diagnosis Date  . ADHD (attention deficit hyperactivity disorder)     No past surgical history on file.  There were no vitals filed for this visit.  Visit Diagnosis:  Difficulty walking  Stiffness of left ankle joint  Weakness of left leg  Abnormality of gait      Subjective Assessment - 10/19/15 1108    Subjective Pt reports that he saw his MD yesterday, who told him that he is healing properly. She told him not to wear his boot at all anymore. Pt reports that his mobility in his ankle has improved, he has less difficulty walking, and his balance is getting better. He still has difficulty with walking long distances and balancing on one leg.    How long can you sit comfortably? no limitations   How long can you stand comfortably? 20 minutes   How long can you walk comfortably? 30 minutes   Currently in Pain? Yes   Pain Score 4    Pain Location Leg   Pain Orientation Left            [facility] PT Assessment - 10/19/15 0001    Assessment   Next MD Visit 11/15/15   Circumferential Edema   Circumferential - Right 24 cm   Circumferential - Left  27 cm   AROM   Left Ankle Dorsiflexion 4   was 3   Left Ankle Plantar Flexion 50  was 50   Left Ankle Inversion 24  was 24   Left Ankle Eversion 12  was 8   Strength   Right Hip Flexion 4+/5  was 4+   Right Hip Extension 4+/5  was 4+   Right Hip ABduction 4+/5  was 4+   Left Hip Flexion 4+/5  was 4-   Left Hip Extension 4/5  was 4   Left Hip ABduction 4/5  was 4   Right Knee Flexion 5/5  was 4+   Right Knee Extension 5/5  was 4+   Left Knee Flexion 4/5  was 4-   Left Knee Extension 4+/5  was 4-   Left Ankle Dorsiflexion 4+/5  was 4-   Left Ankle Plantar Flexion 4-/5  was 4-   Left Ankle Inversion 4/5  was 4-   Left Ankle Eversion 4/5  was 4-                     [facility] Adult PT Treatment/Exercise - 10/19/15 0001    Knee/Hip Exercises: Stretches   Gastroc Stretch 3 reps;30 seconds   Gastroc Stretch Limitations slant board   Ankle Exercises: Standing  BAPS Level 3;Standing;10 reps   SLS 5" max on LLE   Rocker Board 2 minutes   Heel Raises 20 reps   Toe Raise 20 reps   Toe Walk (Round Trip) x2 in // bars   Ankle Exercises: Seated   ABC's 2 reps                PT Education - 10/19/15 1415    Education provided Yes   Education Details HEP updated to include heel raises and SLS   Person(s) Educated Patient   Methods Explanation   Comprehension Verbalized understanding          PT Short Term Goals - 10/19/15 1423    PT SHORT TERM GOAL #1   Title Pt will be independent with HEP.    Time 3   Period Weeks   Status Achieved   PT SHORT TERM GOAL #2   Title Improve left ankle rom by 10 degrees in all planes to improve mobility and gait mechanics.    Time 3   Period Weeks   Status On-going   PT SHORT TERM GOAL #3   Title Improve ankle strength to 4/5 to improve gait mechanics and ankle stability.    Time 3   Period Weeks   Status On-going   PT SHORT TERM GOAL #4   Title Pt will ambulate 150 feet in fracture boot with LRAD or no AD.   Time 3   Period Weeks   Status  Achieved           PT Long Term Goals - 10/19/15 1424    PT LONG TERM GOAL #1   Title Pt will be independent with advanced HEP for ankle ROM, strengthening, and proprioception.   Time 6   Period Weeks   Status On-going   PT LONG TERM GOAL #2   Title Improve ankle ROM by 15 degrees in all planes to improve mobility and normalize gait mechanics.   Time 6   Period Weeks   Status On-going   PT LONG TERM GOAL #3   Title Improve left hip and ankle strength to 4+/5 or greater to improve gait mechanics.   Time 6   Period Weeks   Status On-going   PT LONG TERM GOAL #4   Title Decrease edema in L ankle evidenced by equal circumfrential measurements at superior aspect of malleoli.   Time 6   Period Weeks   Status On-going   PT LONG TERM GOAL #5   Title Pt will ambulate 1,000 feet with no AD and with proper gait mechanics to return to PLOF of community ambulation.    Time 6   Period Weeks   Status On-going   PT LONG TERM GOAL #6   Title Pt will maintain SLS on LLE for 60 seconds or greater to demonstrate good balance and proprioception in LLE.    Time 6   Period Weeks   Status On-going               Plan - 10/19/15 1419    Clinical Impression Statement Reassessment completed today. Pt has made steady progress towards LTGs. His ankle ROM has improved significantly since initial eval, hip and knee strength have improved, and pt's functional activity tolerance has improved. Pt continues to demonstrate decreased strength of L ankle, antalgic gait, impaired balance, and decreased functional activity tolerance. Pt had follow up with MD yesterday, who reports that his fracture is healing properly and to proceed with strengthening  and gait training. Pt will benefit from continued PT to improve strength, balance, gait mechanics, and ankle proprioception.    PT Frequency 3x / week  3x per week x 2 weeks, 2x per week x 2 weeks   PT Duration 4 weeks   PT Treatment/Interventions ADLs/Self  Care Home Management;Gait training;Stair training;Functional mobility training;Therapeutic activities;Therapeutic exercise;Balance training;Neuromuscular re-education;Patient/family education;Manual techniques;Scar mobilization;Passive range of motion   PT Next Visit Plan Continue with standing strengthening, attempt bilateral heel raise with unilateral lowering        Problem List Patient Active Problem List   Diagnosis Date Noted  . SHOULDER DISLOCATION-RECURRENT 11/06/2007    Leona Singleton, PT, DPT [phone] 10/19/2015, 2:25 PM  [facility] [address] [place], [place], [address] Phone: [phone]   Fax:  [phone]  Name: Peter Yu MRN: [id number] Date of Birth: Mar 03, 1991

## 2015-10-21 ENCOUNTER — Ambulatory Visit (HOSPITAL_COMMUNITY): Payer: BLUE CROSS/BLUE SHIELD

## 2015-10-21 DIAGNOSIS — R29898 Other symptoms and signs involving the musculoskeletal system: Secondary | ICD-10-CM

## 2015-10-21 DIAGNOSIS — S82402F Unspecified fracture of shaft of left fibula, subsequent encounter for open fracture type IIIA, IIIB, or IIIC with routine healing: Secondary | ICD-10-CM

## 2015-10-21 DIAGNOSIS — M25672 Stiffness of left ankle, not elsewhere classified: Secondary | ICD-10-CM

## 2015-10-21 DIAGNOSIS — S82202F Unspecified fracture of shaft of left tibia, subsequent encounter for open fracture type IIIA, IIIB, or IIIC with routine healing: Secondary | ICD-10-CM

## 2015-10-21 DIAGNOSIS — R269 Unspecified abnormalities of gait and mobility: Secondary | ICD-10-CM

## 2015-10-21 DIAGNOSIS — R262 Difficulty in walking, not elsewhere classified: Secondary | ICD-10-CM | POA: Diagnosis not present

## 2015-10-21 NOTE — Therapy (Signed)
[facility] [address] [place], [place], [address] Phone: [phone]   Fax:  [phone]  Physical Therapy Treatment  Patient Details  Name: Peter Yu MRN: [id number] Date of Birth: 09/05/1991 No Data Recorded  Encounter Date: 10/21/2015      PT End of Session - 10/21/15 1111    Visit Number 17   Number of Visits 24   Date for PT Re-Evaluation 11/13/15   Authorization Type BCBS- 50 visit limit   Authorization Time Period 09/13/15-11/13/15   Authorization - Visit Number 16   Authorization - Number of Visits 50   PT Start Time 1105   PT Stop Time 1204   PT Time Calculation (min) 59 min   Activity Tolerance Patient tolerated treatment well   Behavior During Therapy [facility] for tasks assessed/performed      Past Medical History  Diagnosis Date  . ADHD (attention deficit hyperactivity disorder)     No past surgical history on file.  There were no vitals filed for this visit.  Visit Diagnosis:  Difficulty walking  Stiffness of left ankle joint  Weakness of left leg  Abnormality of gait  Fracture of left tibia and fibula, open type III, with routine healing, subsequent encounter      Subjective Assessment - 10/21/15 1109    Subjective Pt stated pain scale 3/10 Lt LE 3/10   Currently in Pain? Yes   Pain Score 3    Pain Location Leg   Pain Orientation Left   Pain Descriptors / Indicators Sharp;Aching           [facility] Adult PT Treatment/Exercise - 10/21/15 0001    Knee/Hip Exercises: Stretches   Gastroc Stretch 3 reps;30 seconds   Gastroc Stretch Limitations slant board   Knee/Hip Exercises: Standing   Gait Training 226 x 2: cueing for heel to toe and equal stance phase to reduce limping   Manual Therapy   Manual Therapy Edema management;Soft tissue mobilization;Myofascial release   Edema Management Retro massage supine with elevation   Soft tissue mobilization STM to L peroneals, gastroc, soleus   Myofascial Release to L posterior tibials, peroneals   Ankle Exercises: Standing   BAPS Level 3;Standing;10 reps   SLS Lt LE 5" max of 3   Rocker Board 2 minutes   Heel Raises 20 reps  attempted Bil up and Uni down, unable currently   Toe Raise 20 reps   Heel Walk (Round Trip) 2RT   Toe Walk (Round Trip) x2 in // bars   Ankle Exercises: Seated   Towel Inversion/Eversion 5 reps                  PT Short Term Goals - 10/19/15 1423    PT SHORT TERM GOAL #1   Title Pt will be independent with HEP.    Time 3   Period Weeks   Status Achieved   PT SHORT TERM GOAL #2   Title Improve left ankle rom by 10 degrees in all planes to improve mobility and gait mechanics.    Time 3   Period Weeks   Status On-going   PT SHORT TERM GOAL #3   Title Improve ankle strength to 4/5 to improve gait mechanics and ankle stability.    Time 3   Period Weeks   Status On-going   PT SHORT TERM GOAL #4   Title Pt will ambulate 150 feet in fracture boot with LRAD or no AD.   Time 3  Period Weeks   Status Achieved           PT Long Term Goals - 10/19/15 1424    PT LONG TERM GOAL #1   Title Pt will be independent with advanced HEP for ankle ROM, strengthening, and proprioception.   Time 6   Period Weeks   Status On-going   PT LONG TERM GOAL #2   Title Improve ankle ROM by 15 degrees in all planes to improve mobility and normalize gait mechanics.   Time 6   Period Weeks   Status On-going   PT LONG TERM GOAL #3   Title Improve left hip and ankle strength to 4+/5 or greater to improve gait mechanics.   Time 6   Period Weeks   Status On-going   PT LONG TERM GOAL #4   Title Decrease edema in L ankle evidenced by equal circumfrential measurements at superior aspect of malleoli.   Time 6   Period Weeks   Status On-going   PT LONG TERM GOAL #5   Title Pt will ambulate 1,000 feet with no AD and with proper gait mechanics to return to PLOF of community ambulation.    Time 6   Period  Weeks   Status On-going   PT LONG TERM GOAL #6   Title Pt will maintain SLS on LLE for 60 seconds or greater to demonstrate good balance and proprioception in LLE.    Time 6   Period Weeks   Status On-going               Plan - 10/21/15 1207    Clinical Impression Statement Session focus on improving LE strengthening especially gastroc musculature.  Attempted Bil heel raise and unilateral lowering, gastroc too weak to complete unilateral this session.  Pt educated on importance of reducing limping to improve gait mechanics and reduce compensation.  Ended session wtih manual soft tissue mobalization to reduce gastroc/soleus tightneass and reduced edema on ankle.  No reports of pain at end of session.   PT Next Visit Plan Continue with standing strengthening, attempt bilateral heel raise with unilateral lowering        Problem List Patient Active Problem List   Diagnosis Date Noted  . SHOULDER DISLOCATION-RECURRENT 11/06/2007   Becky Saxasey Auguste Tebbetts, LPTA; CBIS [phone]  Juel BurrowCockerham, Nataliya Graig Jo 10/21/2015, 1:09 PM  [facility] [address] [place], [place], [address] Phone: [phone]   Fax:  [phone]  Name: Peter Yu MRN: [id number] Date of Birth: March 18, 1991

## 2015-10-24 ENCOUNTER — Ambulatory Visit (HOSPITAL_COMMUNITY): Payer: BLUE CROSS/BLUE SHIELD | Admitting: Physical Therapy

## 2015-10-24 DIAGNOSIS — R29898 Other symptoms and signs involving the musculoskeletal system: Secondary | ICD-10-CM

## 2015-10-24 DIAGNOSIS — R262 Difficulty in walking, not elsewhere classified: Secondary | ICD-10-CM

## 2015-10-24 DIAGNOSIS — M25672 Stiffness of left ankle, not elsewhere classified: Secondary | ICD-10-CM

## 2015-10-24 DIAGNOSIS — R269 Unspecified abnormalities of gait and mobility: Secondary | ICD-10-CM

## 2015-10-24 NOTE — Therapy (Signed)
[facility] [address] [place], [place], [address] Phone: [phone]   Fax:  [phone]  Physical Therapy Treatment  Patient Details  Name: Peter Yu MRN: [id number] Date of Birth: August 15, 1991 No Data Recorded  Encounter Date: 10/24/2015      PT End of Session - 10/24/15 1707    Visit Number 18   Number of Visits 24   Date for PT Re-Evaluation 11/13/15   Authorization Type BCBS- 50 visit limit   Authorization Time Period 09/13/15-11/13/15   Authorization - Visit Number 18   Authorization - Number of Visits 50   PT Start Time 1600   PT Stop Time 1655   PT Time Calculation (min) 55 min   Activity Tolerance Patient tolerated treatment well   Behavior [facility] Therapy [facility] for tasks assessed/performed      Past Medical History  Diagnosis Date  . ADHD (attention deficit hyperactivity disorder)     No past surgical history on file.  There were no vitals filed for this visit.  Visit Diagnosis:  Difficulty walking  Stiffness of left ankle joint  Weakness of left leg  Abnormality of gait      Subjective Assessment - 10/24/15 1606    Subjective Pt reports that his leg has been feeling better overall, he did a lot of driving over the weekend.   Currently in Pain? Yes   Pain Score 3    Pain Location Leg                [facility] Adult PT Treatment/Exercise - 10/24/15 0001    Knee/Hip Exercises: Stretches   Gastroc Stretch 3 reps;30 seconds   Gastroc Stretch Limitations slant board   Knee/Hip Exercises: Standing   Gait Training 226 with verbal cueing, treadmill at 1.5 mph x 4 minutes   Manual Therapy   Manual Therapy Soft tissue mobilization;Myofascial release   Soft tissue mobilization STM to L peroneals, gastroc, soleus   Myofascial Release to L posterior tibials, peroneals, scar tissue mobilization to distal tibia   Ankle Exercises: Standing   BAPS Level 4;10 reps   Heel Raises 10 reps   Toe Raise 20 reps    Heel Walk (Round Trip) 2RT   Toe Walk (Round Trip) x2 in // bars                  PT Short Term Goals - 10/19/15 1423    PT SHORT TERM GOAL #1   Title Pt will be independent with HEP.    Time 3   Period Weeks   Status Achieved   PT SHORT TERM GOAL #2   Title Improve left ankle rom by 10 degrees in all planes to improve mobility and gait mechanics.    Time 3   Period Weeks   Status On-going   PT SHORT TERM GOAL #3   Title Improve ankle strength to 4/5 to improve gait mechanics and ankle stability.    Time 3   Period Weeks   Status On-going   PT SHORT TERM GOAL #4   Title Pt will ambulate 150 feet in fracture boot with LRAD or no AD.   Time 3   Period Weeks   Status Achieved           PT Long Term Goals - 10/19/15 1424    PT LONG TERM GOAL #1   Title Pt will be independent with advanced HEP for ankle ROM, strengthening, and proprioception.   Time 6  Period Weeks   Status On-going   PT LONG TERM GOAL #2   Title Improve ankle ROM by 15 degrees in all planes to improve mobility and normalize gait mechanics.   Time 6   Period Weeks   Status On-going   PT LONG TERM GOAL #3   Title Improve left hip and ankle strength to 4+/5 or greater to improve gait mechanics.   Time 6   Period Weeks   Status On-going   PT LONG TERM GOAL #4   Title Decrease edema in L ankle evidenced by equal circumfrential measurements at superior aspect of malleoli.   Time 6   Period Weeks   Status On-going   PT LONG TERM GOAL #5   Title Pt will ambulate 1,000 feet with no AD and with proper gait mechanics to return to PLOF of community ambulation.    Time 6   Period Weeks   Status On-going   PT LONG TERM GOAL #6   Title Pt will maintain SLS on LLE for 60 seconds or greater to demonstrate good balance and proprioception in LLE.    Time 6   Period Weeks   Status On-going               Plan - 10/24/15 1707    Clinical Impression Statement Continued with strengthening,  gait training, and manual today. Pt was able to complete bilateral heel raise with unilateral lowering today with UE support and verbal cueing. Pt was able to progress to level 4 on BAPS board with reports of discomfort, but no acute pain. Pt continues to c/o sharp pain with ambulation in distal tibia, scar tissue mobilization and myofasical release were completed  to decrease pain. Treadmill gait training was added today to equalize step length with cueing for proper heel strike. Pt reported decreased pain post treatment.    PT Next Visit Plan Continue with scar tissue mobilization PRN, gait training        Problem List Patient Active Problem List   Diagnosis Date Noted  . SHOULDER DISLOCATION-RECURRENT 11/06/2007    Leona Singleton, PT, DPT [phone] 10/24/2015, 5:12 PM  [facility] [address] [place], [place], [address] Phone: [phone]   Fax:  [phone]  Name: Peter Yu MRN: [id number] Date of Birth: Jul 31, 1991

## 2015-10-26 ENCOUNTER — Ambulatory Visit (HOSPITAL_COMMUNITY): Payer: BLUE CROSS/BLUE SHIELD

## 2015-10-26 DIAGNOSIS — R262 Difficulty in walking, not elsewhere classified: Secondary | ICD-10-CM

## 2015-10-26 DIAGNOSIS — S82402F Unspecified fracture of shaft of left fibula, subsequent encounter for open fracture type IIIA, IIIB, or IIIC with routine healing: Secondary | ICD-10-CM

## 2015-10-26 DIAGNOSIS — R269 Unspecified abnormalities of gait and mobility: Secondary | ICD-10-CM

## 2015-10-26 DIAGNOSIS — S82202F Unspecified fracture of shaft of left tibia, subsequent encounter for open fracture type IIIA, IIIB, or IIIC with routine healing: Secondary | ICD-10-CM

## 2015-10-26 DIAGNOSIS — R29898 Other symptoms and signs involving the musculoskeletal system: Secondary | ICD-10-CM

## 2015-10-26 DIAGNOSIS — M25672 Stiffness of left ankle, not elsewhere classified: Secondary | ICD-10-CM

## 2015-10-26 NOTE — Therapy (Signed)
[facility] [address] [place], [place], [address] Phone: [phone]   Fax:  [phone]  Physical Therapy Treatment  Patient Details  Name: Peter Yu MRN: [id number] Date of Birth: 01/02/1991 No Data Recorded  Encounter Date: 10/26/2015      PT End of Session - 10/26/15 1703    Visit Number 19   Number of Visits 24   Date for PT Re-Evaluation 11/13/15   Authorization Type BCBS- 50 visit limit   Authorization Time Period 09/13/15-11/13/15   Authorization - Visit Number 19   Authorization - Number of Visits 50   PT Start Time 1558   PT Stop Time 1645   PT Time Calculation (min) 47 min   Activity Tolerance Patient tolerated treatment well   Behavior During Therapy [facility] for tasks assessed/performed      Past Medical History  Diagnosis Date  . ADHD (attention deficit hyperactivity disorder)     No past surgical history on file.  There were no vitals filed for this visit.  Visit Diagnosis:  Difficulty walking  Stiffness of left ankle joint  Weakness of left leg  Abnormality of gait  Fracture of left tibia and fibula, open type III, with routine healing, subsequent encounter      Subjective Assessment - 10/26/15 1554    Subjective Pain free today, took advil prior session.  Pt feels most difficulty with balance and walking   Currently in Pain? No/denies            [facility] - 10/26/15 1634    Assessment   Medical Diagnosis L leg fracture   Onset Date/Surgical Date 08/13/15   Next MD Visit 11/15/15             [facility]/Ex[facility] - 10/26/15 0001    Knee/Hip Exercises: Stretches   Gastroc Stretch 3 reps;30 seconds   Gastroc Stretch Limitations slant board   Knee/Hip Exercises: Aerobic   Tread Mill 1.5-->2.0 x 5' with verbal cueing   Knee/Hip Exercises: Standing   Gait Training 25326ft with cueing for heel to toe pattern, increase stance phase and stride length   Manual  Therapy   Manual Therapy Soft tissue mobilization;Myofascial release   Soft tissue mobilization STM to L peroneals, gastroc, soleus   Myofascial Release to L posterior tibials, peroneals, scar tissue mobilization to distal tibia   Ankle Exercises: Standing   BAPS Level 4;10 reps   SLS Lt LE 11" max of 3   Rocker Board 2 minutes   Heel Raises 20 reps  2x 10reps Bil ascend Uni lower    Toe Raise 20 reps   Heel Walk (Round Trip) 2RT   Toe Walk (Round Trip) x2 in // bars   Other Standing Ankle Exercises sidestepping RTB 1RT   Ankle Exercises: Sidelying   Other Sidelying Ankle Exercises Lt LE abduction 185x                  PT Short Term Goals - 10/19/15 1423    PT SHORT TERM GOAL #1   Title Pt will be independent with HEP.    Time 3   Period Weeks   Status Achieved   PT SHORT TERM GOAL #2   Title Improve left ankle rom by 10 degrees in all planes to improve mobility and gait mechanics.    Time 3   Period Weeks   Status On-going   PT SHORT TERM GOAL #3   Title Improve ankle  strength to 4/5 to improve gait mechanics and ankle stability.    Time 3   Period Weeks   Status On-going   PT SHORT TERM GOAL #4   Title Pt will ambulate 150 feet in fracture boot with LRAD or no AD.   Time 3   Period Weeks   Status Achieved           PT Long Term Goals - 10/19/15 1424    PT LONG TERM GOAL #1   Title Pt will be independent with advanced HEP for ankle ROM, strengthening, and proprioception.   Time 6   Period Weeks   Status On-going   PT LONG TERM GOAL #2   Title Improve ankle ROM by 15 degrees in all planes to improve mobility and normalize gait mechanics.   Time 6   Period Weeks   Status On-going   PT LONG TERM GOAL #3   Title Improve left hip and ankle strength to 4+/5 or greater to improve gait mechanics.   Time 6   Period Weeks   Status On-going   PT LONG TERM GOAL #4   Title Decrease edema in L ankle evidenced by equal circumfrential measurements at superior  aspect of malleoli.   Time 6   Period Weeks   Status On-going   PT LONG TERM GOAL #5   Title Pt will ambulate 1,000 feet with no AD and with proper gait mechanics to return to PLOF of community ambulation.    Time 6   Period Weeks   Status On-going   PT LONG TERM GOAL #6   Title Pt will maintain SLS on LLE for 60 seconds or greater to demonstrate good balance and proprioception in LLE.    Time 6   Period Weeks   Status On-going               Plan - 10/26/15 1746    Clinical Impression Statement Session focus on ankle strengthening and gait training.  Pt continues to demonstrate weak LE musculuatre, able to complete Bil heel raise with unilateral lowering but does require UE support and verbal cueing.  Pt continues to have difficutly with balance, noted hip dropping when shifting into single leg stance, tested glut med 4/5 strength.  Added glut med strengthening exercises to assist with balance. Cueing with gait for heel to toe pattern and to equalize stride length and stance phase.  Continued with gait training on treadmill to improve mechanics with reports of sharp pain with heel to toe pattern.  No reports of pain at end of session following manual.     PT Next Visit Plan Continue with scar tissue mobilization PRN, gait training        Problem List Patient Active Problem List   Diagnosis Date Noted  . SHOULDER DISLOCATION-RECURRENT 11/06/2007   Becky Sax, LPTA; CBIS [phone]  Juel Burrow 10/26/2015, 6:06 PM  [place] [facility] [address] [place], [place], [address] Phone: [phone]   Fax:  [phone]  Name: Peter Yu MRN: [id number] Date of Birth: 1991/11/06

## 2015-10-28 ENCOUNTER — Ambulatory Visit (HOSPITAL_COMMUNITY): Payer: BLUE CROSS/BLUE SHIELD

## 2015-10-28 DIAGNOSIS — S82202F Unspecified fracture of shaft of left tibia, subsequent encounter for open fracture type IIIA, IIIB, or IIIC with routine healing: Secondary | ICD-10-CM

## 2015-10-28 DIAGNOSIS — R262 Difficulty in walking, not elsewhere classified: Secondary | ICD-10-CM

## 2015-10-28 DIAGNOSIS — S82402F Unspecified fracture of shaft of left fibula, subsequent encounter for open fracture type IIIA, IIIB, or IIIC with routine healing: Secondary | ICD-10-CM

## 2015-10-28 DIAGNOSIS — M25672 Stiffness of left ankle, not elsewhere classified: Secondary | ICD-10-CM

## 2015-10-28 DIAGNOSIS — R269 Unspecified abnormalities of gait and mobility: Secondary | ICD-10-CM

## 2015-10-28 DIAGNOSIS — R29898 Other symptoms and signs involving the musculoskeletal system: Secondary | ICD-10-CM

## 2015-10-28 NOTE — Therapy (Signed)
[facility] [address] [place], [place], [address] Phone: [phone]   Fax:  [phone]  Physical Therapy Treatment  Patient Details  Name: Peter RingsGunnar E Pitcock MRN: [id number] Date of Birth: 1991/01/14 No Data Recorded  Encounter Date: 10/28/2015      PT End of Session - 10/28/15 1238    Visit Number 20   Number of Visits 24   Date for PT Re-Evaluation 11/13/15   Authorization Type BCBS- 50 visit limit   Authorization Time Period 09/13/15-11/13/15   Authorization - Visit Number 20   Authorization - Number of Visits 50   PT Start Time 1150   PT Stop Time 1239   PT Time Calculation (min) 49 min   Activity Tolerance Patient tolerated treatment well   Behavior During Therapy [facility] for tasks assessed/performed      Past Medical History  Diagnosis Date  . ADHD (attention deficit hyperactivity disorder)     No past surgical history on file.  There were no vitals filed for this visit.  Visit Diagnosis:  Difficulty walking  Stiffness of left ankle joint  Weakness of left leg  Abnormality of gait  Fracture of left tibia and fibula, open type III, with routine healing, subsequent encounter      Subjective Assessment - 10/28/15 1204    Subjective Pain scale 3/10 today, sharp pain   Currently in Pain? Yes   Pain Score 3    Pain Location Leg   Pain Orientation Left   Pain Descriptors / Indicators Sharp;Aching            [facility] Adult PT Treatment/Exercise - 10/28/15 0001    Knee/Hip Exercises: Stretches   Gastroc Stretch 3 reps;30 seconds   Gastroc Stretch Limitations slant board   Knee/Hip Exercises: Aerobic   Tread Mill 1.5-->1.8 x 6' with verbal cueing for heel to toe and equal stance phase   Manual Therapy   Manual Therapy Soft tissue mobilization;Myofascial release;Passive ROM   Soft tissue mobilization STM to L peroneals, gastroc, soleus   Myofascial Release to L posterior tibials, peroneals, scar tissue  mobilization to distal tibia   Passive ROM PROM all directions   Ankle Exercises: Standing   BAPS Level 4;10 reps   SLS Lt LE 12" max of 5   Rocker Board 2 minutes   Heel Raises 20 reps   Toe Raise 20 reps   Heel Walk (Round Trip) 2RT   Toe Walk (Round Trip) x2 in // bars   Other Standing Ankle Exercises sidestepping RTB 1RT           PT Short Term Goals - 10/19/15 1423    PT SHORT TERM GOAL #1   Title Pt will be independent with HEP.    Time 3   Period Weeks   Status Achieved   PT SHORT TERM GOAL #2   Title Improve left ankle rom by 10 degrees in all planes to improve mobility and gait mechanics.    Time 3   Period Weeks   Status On-going   PT SHORT TERM GOAL #3   Title Improve ankle strength to 4/5 to improve gait mechanics and ankle stability.    Time 3   Period Weeks   Status On-going   PT SHORT TERM GOAL #4   Title Pt will ambulate 150 feet in fracture boot with LRAD or no AD.   Time 3   Period Weeks   Status Achieved  PT Long Term Goals - 10/19/15 1424    PT LONG TERM GOAL #1   Title Pt will be independent with advanced HEP for ankle ROM, strengthening, and proprioception.   Time 6   Period Weeks   Status On-going   PT LONG TERM GOAL #2   Title Improve ankle ROM by 15 degrees in all planes to improve mobility and normalize gait mechanics.   Time 6   Period Weeks   Status On-going   PT LONG TERM GOAL #3   Title Improve left hip and ankle strength to 4+/5 or greater to improve gait mechanics.   Time 6   Period Weeks   Status On-going   PT LONG TERM GOAL #4   Title Decrease edema in L ankle evidenced by equal circumfrential measurements at superior aspect of malleoli.   Time 6   Period Weeks   Status On-going   PT LONG TERM GOAL #5   Title Pt will ambulate 1,000 feet with no AD and with proper gait mechanics to return to PLOF of community ambulation.    Time 6   Period Weeks   Status On-going   PT LONG TERM GOAL #6   Title Pt will  maintain SLS on LLE for 60 seconds or greater to demonstrate good balance and proprioception in LLE.    Time 6   Period Weeks   Status On-going               Plan - 10/28/15 1252    Clinical Impression Statement Continued session focus on improving ankle strengtheing and gait training.  Pt continues to demonstrate weak LE musculature especailly gastroc, was able to complete BLE heel raises with ULE lower but requires UE support.  Began heel raises in split stace for increased weight bearing for strengthening without HHA, BLE required.  Manual techniques compelte to reduce overall tightness in gastroc/soleus comples, MFR to peronals and PROM to ankle to improve ROM and ankle mobilty.  Pt continues to require minimal cueing for heel to toe pattern and increase stance phase Lt LE during gait.  Pt c/o sharp pain with heel to toe gait pattern.     PT Next Visit Plan Continue with scar tissue mobilization PRN, gait training        Problem List Patient Active Problem List   Diagnosis Date Noted  . SHOULDER DISLOCATION-RECURRENT 11/06/2007   Becky Sax, LPTA; CBIS [phone]  Peter Yu 10/28/2015, 1:00 PM  [facility] [address] [place], [place], [address] Phone: [phone]   Fax:  [phone]  Name: Peter Yu MRN: [id number] Date of Birth: 07/17/1991

## 2015-10-31 ENCOUNTER — Ambulatory Visit (HOSPITAL_COMMUNITY): Payer: BLUE CROSS/BLUE SHIELD | Admitting: Physical Therapy

## 2015-10-31 DIAGNOSIS — M25672 Stiffness of left ankle, not elsewhere classified: Secondary | ICD-10-CM

## 2015-10-31 DIAGNOSIS — R269 Unspecified abnormalities of gait and mobility: Secondary | ICD-10-CM

## 2015-10-31 DIAGNOSIS — R29898 Other symptoms and signs involving the musculoskeletal system: Secondary | ICD-10-CM

## 2015-10-31 DIAGNOSIS — R262 Difficulty in walking, not elsewhere classified: Secondary | ICD-10-CM | POA: Diagnosis not present

## 2015-10-31 NOTE — Therapy (Signed)
[facility] [address] [place], [place], [address] Phone: [phone]   Fax:  [phone]  Physical Therapy Treatment  Patient Details  Name: Peter Yu MRN: [id number] Date of Birth: 23-Nov-1991 No Data Recorded  Encounter Date: 10/31/2015      PT End of Session - 10/31/15 1040    Visit Number 21   Number of Visits 24   Date for PT Re-Evaluation 11/13/15   Authorization Type BCBS- 50 visit limit   Authorization Time Period 09/13/15-11/13/15   Authorization - Visit Number 21   Authorization - Number of Visits 50   PT Start Time 0800   PT Stop Time 0850   PT Time Calculation (min) 50 min   Activity Tolerance Patient tolerated treatment well   Behavior During Therapy [facility] for tasks assessed/performed      Past Medical History  Diagnosis Date  . ADHD (attention deficit hyperactivity disorder)     No past surgical history on file.  There were no vitals filed for this visit.  Visit Diagnosis:  Difficulty walking  Stiffness of left ankle joint  Weakness of left leg  Abnormality of gait      Subjective Assessment - 10/31/15 0808    Subjective Pt reports that he still has some pain, 3/10 today. He reports that he is going to get a second opinion on his leg today.    Currently in Pain? Yes   Pain Score 3    Pain Location Leg   Pain Orientation Left               [facility] Adult PT Treatment/Exercise - 10/31/15 0001    Knee/Hip Exercises: Stretches   Gastroc Stretch 3 reps;30 seconds   Gastroc Stretch Limitations slant board   Knee/Hip Exercises: Standing   Functional Squat 15 reps   Functional Squat Limitations RLE forward to bias LLE   Other Standing Knee Exercises RDL to mat table x 10   Knee/Hip Exercises: Supine   Single Leg Bridge 10 reps   Ankle Exercises: Standing   BAPS Level 5;10 reps   SLS LLE 32" max   Heel Raises 20 reps  20 reps + 10 reps with unilateral lower   Toe Raise 20 reps   Heel  Walk (Round Trip) 2RT   Toe Walk (Round Trip) x1 RT with UE assist   Other Standing Ankle Exercises sidestepping with GTB x 2RT, monster walk with GTB x 2RT                  PT Short Term Goals - 10/19/15 1423    PT SHORT TERM GOAL #1   Title Pt will be independent with HEP.    Time 3   Period Weeks   Status Achieved   PT SHORT TERM GOAL #2   Title Improve left ankle rom by 10 degrees in all planes to improve mobility and gait mechanics.    Time 3   Period Weeks   Status On-going   PT SHORT TERM GOAL #3   Title Improve ankle strength to 4/5 to improve gait mechanics and ankle stability.    Time 3   Period Weeks   Status On-going   PT SHORT TERM GOAL #4   Title Pt will ambulate 150 feet in fracture boot with LRAD or no AD.   Time 3   Period Weeks   Status Achieved           PT Long Term Goals -  10/19/15 1424    PT LONG TERM GOAL #1   Title Pt will be independent with advanced HEP for ankle ROM, strengthening, and proprioception.   Time 6   Period Weeks   Status On-going   PT LONG TERM GOAL #2   Title Improve ankle ROM by 15 degrees in all planes to improve mobility and normalize gait mechanics.   Time 6   Period Weeks   Status On-going   PT LONG TERM GOAL #3   Title Improve left hip and ankle strength to 4+/5 or greater to improve gait mechanics.   Time 6   Period Weeks   Status On-going   PT LONG TERM GOAL #4   Title Decrease edema in L ankle evidenced by equal circumfrential measurements at superior aspect of malleoli.   Time 6   Period Weeks   Status On-going   PT LONG TERM GOAL #5   Title Pt will ambulate 1,000 feet with no AD and with proper gait mechanics to return to PLOF of community ambulation.    Time 6   Period Weeks   Status On-going   PT LONG TERM GOAL #6   Title Pt will maintain SLS on LLE for 60 seconds or greater to demonstrate good balance and proprioception in LLE.    Time 6   Period Weeks   Status On-going                Plan - 10/31/15 1041    Clinical Impression Statement Treatment session focused on improving hip, knee, and ankle strength in today's treatment. Glut med strengthening consisted of sidestepping with GTB, monster walks with GTB, and [place] dead lifts to elevated mat table in order to improve hip stability with gait and decrease stress on tib/fib with ambulation. Pt required max cueing with RDLs to complete with proper form and to prevent lumbar flexion. Squats to mat table were added with RLE forward to bias LLE, pt required verbal and tactile cueing to complete with proper form.    PT Next Visit Plan Reintroduce scar tissue mobilization, continue with glut and quad strengthening        Problem List Patient Active Problem List   Diagnosis Date Noted  . SHOULDER DISLOCATION-RECURRENT 11/06/2007    Leona Singleton, PT, DPT [phone] 10/31/2015, 10:55 AM  [facility] [address] [place], [place], [address] Phone: [phone]   Fax:  [phone]  Name: Peter Yu MRN: [id number] Date of Birth: Jan 27, 1991

## 2015-11-02 ENCOUNTER — Ambulatory Visit (HOSPITAL_COMMUNITY): Payer: BLUE CROSS/BLUE SHIELD | Admitting: Physical Therapy

## 2015-11-02 DIAGNOSIS — R269 Unspecified abnormalities of gait and mobility: Secondary | ICD-10-CM

## 2015-11-02 DIAGNOSIS — R262 Difficulty in walking, not elsewhere classified: Secondary | ICD-10-CM

## 2015-11-02 DIAGNOSIS — R29898 Other symptoms and signs involving the musculoskeletal system: Secondary | ICD-10-CM

## 2015-11-02 DIAGNOSIS — M25672 Stiffness of left ankle, not elsewhere classified: Secondary | ICD-10-CM

## 2015-11-02 NOTE — Therapy (Signed)
[facility] [address] [place], [place], [address] Phone: [phone]   Fax:  [phone]  Physical Therapy Treatment  Patient Details  Name: Peter Yu MRN: [id number] Date of Birth: 05/28/1991 No Data Recorded  Encounter Date: 11/02/2015      PT End of Session - 11/02/15 1507    Visit Number 22   Number of Visits 24   Date for PT Re-Evaluation 11/13/15   Authorization Type BCBS- 50 visit limit   Authorization Time Period 09/13/15-11/13/15   Authorization - Visit Number 22   Authorization - Number of Visits 50   PT Start Time 1015   PT Stop Time 1100   PT Time Calculation (min) 45 min   Activity Tolerance Patient tolerated treatment well   Behavior During Therapy [facility] for tasks assessed/performed      Past Medical History  Diagnosis Date  . ADHD (attention deficit hyperactivity disorder)     No past surgical history on file.  There were no vitals filed for this visit.  Visit Diagnosis:  Difficulty walking  Stiffness of left ankle joint  Weakness of left leg  Abnormality of gait      Subjective Assessment - 11/02/15 1025    Subjective Pt reports that he took a muscle relaxer this morning, and it has helped his pain.    Currently in Pain? Yes   Pain Score 3    Pain Location Leg   Pain Orientation Left                  [facility] Adult PT Treatment/Exercise - 11/02/15 0001    Knee/Hip Exercises: Stretches   Gastroc Stretch 3 reps;30 seconds   Gastroc Stretch Limitations slant board   Knee/Hip Exercises: Standing   Functional Squat 10 reps   Functional Squat Limitations single leg squats to mat table   Wall Squat 10 reps   Wall Squat Limitations RLE forward to bias LLE   Other Standing Knee Exercises RDL to mat table x 10   Knee/Hip Exercises: Supine   Single Leg Bridge 10 reps   Knee/Hip Exercises: Sidelying   Hip ABduction 2 sets;10 reps   Hip ABduction Limitations with heel againt wall, 10  reps no resistance, 10 reps with resisted extension   Ankle Exercises: Standing   BAPS Level 5;10 reps   Rocker Board 2 minutes   Heel Raises 15 reps  unilateral lower off of 2" step   Heel Walk (Round Trip) 2RT   Toe Walk (Round Trip) x2RT with UE assist                  PT Short Term Goals - 10/19/15 1423    PT SHORT TERM GOAL #1   Title Pt will be independent with HEP.    Time 3   Period Weeks   Status Achieved   PT SHORT TERM GOAL #2   Title Improve left ankle rom by 10 degrees in all planes to improve mobility and gait mechanics.    Time 3   Period Weeks   Status On-going   PT SHORT TERM GOAL #3   Title Improve ankle strength to 4/5 to improve gait mechanics and ankle stability.    Time 3   Period Weeks   Status On-going   PT SHORT TERM GOAL #4   Title Pt will ambulate 150 feet in fracture boot with LRAD or no AD.   Time 3   Period Weeks   Status  Achieved           PT Long Term Goals - 10/19/15 1424    PT LONG TERM GOAL #1   Title Pt will be independent with advanced HEP for ankle ROM, strengthening, and proprioception.   Time 6   Period Weeks   Status On-going   PT LONG TERM GOAL #2   Title Improve ankle ROM by 15 degrees in all planes to improve mobility and normalize gait mechanics.   Time 6   Period Weeks   Status On-going   PT LONG TERM GOAL #3   Title Improve left hip and ankle strength to 4+/5 or greater to improve gait mechanics.   Time 6   Period Weeks   Status On-going   PT LONG TERM GOAL #4   Title Decrease edema in L ankle evidenced by equal circumfrential measurements at superior aspect of malleoli.   Time 6   Period Weeks   Status On-going   PT LONG TERM GOAL #5   Title Pt will ambulate 1,000 feet with no AD and with proper gait mechanics to return to PLOF of community ambulation.    Time 6   Period Weeks   Status On-going   PT LONG TERM GOAL #6   Title Pt will maintain SLS on LLE for 60 seconds or greater to demonstrate  good balance and proprioception in LLE.    Time 6   Period Weeks   Status On-going               Plan - 11/02/15 1509    Clinical Impression Statement Continued with functional strengthening for gastroc, hip abductors, and quads today to improve gait. Pt continues to require verbal cueing when ambulating in clinic for proper heel strike and equal step length. Sidelying abduction with resisted extension added today to improve glut med strength to decrease stresses on tib/fib. Pt continued to require verbal and tactile cueing for RDLs to complete with proper form. Pt denied any pain post treatment.    PT Next Visit Plan Reintroduce scar tissue mobilization, continue with glut and quad strengthening        Problem List Patient Active Problem List   Diagnosis Date Noted  . SHOULDER DISLOCATION-RECURRENT 11/06/2007    Leona SingletonLauren Emma Schupp, PT, DPT [phone] 11/02/2015, 3:12 PM  [facility] [address] [place], [place], [address] Phone: [phone]   Fax:  [phone]  Name: Peter Yu MRN: [id number] Date of Birth: 04/12/1991

## 2015-11-04 ENCOUNTER — Ambulatory Visit (HOSPITAL_COMMUNITY): Payer: BLUE CROSS/BLUE SHIELD | Admitting: Physical Therapy

## 2015-11-04 DIAGNOSIS — R29898 Other symptoms and signs involving the musculoskeletal system: Secondary | ICD-10-CM

## 2015-11-04 DIAGNOSIS — M25672 Stiffness of left ankle, not elsewhere classified: Secondary | ICD-10-CM

## 2015-11-04 DIAGNOSIS — R262 Difficulty in walking, not elsewhere classified: Secondary | ICD-10-CM

## 2015-11-04 DIAGNOSIS — R269 Unspecified abnormalities of gait and mobility: Secondary | ICD-10-CM

## 2015-11-04 NOTE — Therapy (Signed)
[facility] [address] [place], [place], [address] Phone: [phone]   Fax:  [phone]  Physical Therapy Treatment  Patient Details  Name: Ma RingsGunnar E Stivers MRN: [id number] Date of Birth: Feb 26, 1991 No Data Recorded  Encounter Date: 11/04/2015      PT End of Session - 11/04/15 1220    Visit Number 23   Number of Visits 24   Date for PT Re-Evaluation 11/13/15   Authorization Type BCBS- 50 visit limit   Authorization Time Period 09/13/15-11/13/15   Authorization - Visit Number 23   Authorization - Number of Visits 50   PT Start Time 1016   PT Stop Time 1104   PT Time Calculation (min) 48 min   Activity Tolerance Patient tolerated treatment well   Behavior During Therapy [facility] for tasks assessed/performed      Past Medical History  Diagnosis Date  . ADHD (attention deficit hyperactivity disorder)     No past surgical history on file.  There were no vitals filed for this visit.  Visit Diagnosis:  Difficulty walking  Stiffness of left ankle joint  Weakness of left leg  Abnormality of gait      Subjective Assessment - 11/04/15 1022    Subjective Pt reports that he feels pretty stiff today.    Currently in Pain? Yes   Pain Score 3    Pain Location Leg   Pain Orientation Left                         [facility] Adult PT Treatment/Exercise - 11/04/15 0001    Knee/Hip Exercises: Stretches   Gastroc Stretch 3 reps;30 seconds   Gastroc Stretch Limitations slant board   Knee/Hip Exercises: Standing   Gait Training retroambulation x 2RT, tandem gait x 2RT   Other Standing Knee Exercises tandem gait on balance beam x 2RT   Manual Therapy   Manual Therapy Myofascial release   Myofascial Release to L posterior tibials, peroneals, scar tissue mobilization to distal tibia   Ankle Exercises: Standing   BAPS Level 5;10 reps   Rocker Board 2 minutes   Heel Raises 10 reps  with unilateral lower off of 4" step   Heel Walk (Round Trip) 2RT                  PT Short Term Goals - 10/19/15 1423    PT SHORT TERM GOAL #1   Title Pt will be independent with HEP.    Time 3   Period Weeks   Status Achieved   PT SHORT TERM GOAL #2   Title Improve left ankle rom by 10 degrees in all planes to improve mobility and gait mechanics.    Time 3   Period Weeks   Status On-going   PT SHORT TERM GOAL #3   Title Improve ankle strength to 4/5 to improve gait mechanics and ankle stability.    Time 3   Period Weeks   Status On-going   PT SHORT TERM GOAL #4   Title Pt will ambulate 150 feet in fracture boot with LRAD or no AD.   Time 3   Period Weeks   Status Achieved           PT Long Term Goals - 10/19/15 1424    PT LONG TERM GOAL #1   Title Pt will be independent with advanced HEP for ankle ROM, strengthening, and proprioception.   Time 6   Period  Weeks   Status On-going   PT LONG TERM GOAL #2   Title Improve ankle ROM by 15 degrees in all planes to improve mobility and normalize gait mechanics.   Time 6   Period Weeks   Status On-going   PT LONG TERM GOAL #3   Title Improve left hip and ankle strength to 4+/5 or greater to improve gait mechanics.   Time 6   Period Weeks   Status On-going   PT LONG TERM GOAL #4   Title Decrease edema in L ankle evidenced by equal circumfrential measurements at superior aspect of malleoli.   Time 6   Period Weeks   Status On-going   PT LONG TERM GOAL #5   Title Pt will ambulate 1,000 feet with no AD and with proper gait mechanics to return to PLOF of community ambulation.    Time 6   Period Weeks   Status On-going   PT LONG TERM GOAL #6   Title Pt will maintain SLS on LLE for 60 seconds or greater to demonstrate good balance and proprioception in LLE.    Time 6   Period Weeks   Status On-going               Plan - 11/04/15 1221    Clinical Impression Statement Tandem gait on solid surface and on balance beam added today to improve  ankle stability and proprioception, pt required verbal cueing for speed of movement and CGA for safety. Pt continues to demonstrate difficulty with proper gait mechanics, reporting that it is painful for him to complete heel strike. Retroambulation was completed to improve contact of entire foot in gait, pt was able to complete without pain. Pt denied any increased pain post treatment.    PT Next Visit Plan Continue with balance beam, step ups on BOSU, manual to scar tissue        Problem List Patient Active Problem List   Diagnosis Date Noted  . SHOULDER DISLOCATION-RECURRENT 11/06/2007    Leona Singleton, PT, DPT [phone] 11/04/2015, 12:23 PM  [facility] [address] [place], [place], [address] Phone: [phone]   Fax:  [phone]  Name: ALOYSIOUS VANGIESON MRN: [id number] Date of Birth: 04/04/1991

## 2015-11-08 ENCOUNTER — Ambulatory Visit (HOSPITAL_COMMUNITY): Payer: BLUE CROSS/BLUE SHIELD | Admitting: Physical Therapy

## 2015-11-08 DIAGNOSIS — R29898 Other symptoms and signs involving the musculoskeletal system: Secondary | ICD-10-CM

## 2015-11-08 DIAGNOSIS — R262 Difficulty in walking, not elsewhere classified: Secondary | ICD-10-CM

## 2015-11-08 DIAGNOSIS — M25672 Stiffness of left ankle, not elsewhere classified: Secondary | ICD-10-CM

## 2015-11-08 DIAGNOSIS — R269 Unspecified abnormalities of gait and mobility: Secondary | ICD-10-CM

## 2015-11-08 NOTE — Therapy (Signed)
[facility] [address] [place], [place], [address] Phone: [phone]   Fax:  [phone]  Physical Therapy Treatment  Patient Details  Name: Peter Yu MRN: [id number] Date of Birth: 1991/01/18 No Data Recorded  Encounter Date: 11/08/2015      PT End of Session - 11/08/15 1214    Visit Number 24   Number of Visits 28   Date for PT Re-Evaluation 11/13/15   Authorization Type BCBS- 50 visit limit   Authorization Time Period 09/13/15-11/13/15   Authorization - Visit Number 24   Authorization - Number of Visits 50   PT Start Time 1015   PT Stop Time 1058   PT Time Calculation (min) 43 min   Activity Tolerance Patient tolerated treatment well   Behavior During Therapy [facility] for tasks assessed/performed      Past Medical History  Diagnosis Date  . ADHD (attention deficit hyperactivity disorder)     No past surgical history on file.  There were no vitals filed for this visit.  Visit Diagnosis:  Difficulty walking  Stiffness of left ankle joint  Weakness of left leg  Abnormality of gait      Subjective Assessment - 11/08/15 1023    Subjective Pt reports that he is still having trouble walking. He reports that his leg has been feeling better over the past few days.    Currently in Pain? Yes   Pain Score 3    Pain Location Leg   Pain Orientation Left                         [facility] Adult PT Treatment/Exercise - 11/08/15 0001    Knee/Hip Exercises: Stretches   Gastroc Stretch 3 reps;30 seconds   Gastroc Stretch Limitations slant board   Knee/Hip Exercises: Standing   Functional Squat 5 reps;10 reps   Functional Squat Limitations on power tower at 23, 5 reps single leg, 10 reps bilateral   Gait Training 450 feet   Other Standing Knee Exercises tandem gait, sidestepping on balance beam x 2RT   Ankle Exercises: Seated   ABC's 2 reps   Ankle Exercises: Standing   SLS 8" max   Heel Raises 15 reps   on power tower at level 20   Heel Walk (Round Trip) 2RT   Toe Walk (Round Trip) x2RT                  PT Short Term Goals - 10/19/15 1423    PT SHORT TERM GOAL #1   Title Pt will be independent with HEP.    Time 3   Period Weeks   Status Achieved   PT SHORT TERM GOAL #2   Title Improve left ankle rom by 10 degrees in all planes to improve mobility and gait mechanics.    Time 3   Period Weeks   Status On-going   PT SHORT TERM GOAL #3   Title Improve ankle strength to 4/5 to improve gait mechanics and ankle stability.    Time 3   Period Weeks   Status On-going   PT SHORT TERM GOAL #4   Title Pt will ambulate 150 feet in fracture boot with LRAD or no AD.   Time 3   Period Weeks   Status Achieved           PT Long Term Goals - 10/19/15 1424    PT LONG TERM GOAL #1   Title  Pt will be independent with advanced HEP for ankle ROM, strengthening, and proprioception.   Time 6   Period Weeks   Status On-going   PT LONG TERM GOAL #2   Title Improve ankle ROM by 15 degrees in all planes to improve mobility and normalize gait mechanics.   Time 6   Period Weeks   Status On-going   PT LONG TERM GOAL #3   Title Improve left hip and ankle strength to 4+/5 or greater to improve gait mechanics.   Time 6   Period Weeks   Status On-going   PT LONG TERM GOAL #4   Title Decrease edema in L ankle evidenced by equal circumfrential measurements at superior aspect of malleoli.   Time 6   Period Weeks   Status On-going   PT LONG TERM GOAL #5   Title Pt will ambulate 1,000 feet with no AD and with proper gait mechanics to return to PLOF of community ambulation.    Time 6   Period Weeks   Status On-going   PT LONG TERM GOAL #6   Title Pt will maintain SLS on LLE for 60 seconds or greater to demonstrate good balance and proprioception in LLE.    Time 6   Period Weeks   Status On-going               Plan - 11/08/15 1215    Clinical Impression Statement Pt was  able to complete toe walking in today's treatment without UE assist with verbal cueing for step length. Single leg squats and heel raises were completed on power tower today, pt required verbal cueing for proper technique during both exercises. Pt continues to demonstrate difficulty walking, requiring verbal cueing during gait training for heel-toe gait pattern and to avoid trunk lean. Pt educated again on importance of compliance with HEP.    PT Next Visit Plan Reassess next session        Problem List Patient Active Problem List   Diagnosis Date Noted  . SHOULDER DISLOCATION-RECURRENT 11/06/2007    Leona SingletonLauren Lyna Laningham, PT, DPT [phone]  11/08/2015, 12:22 PM  [facility] [address] [place], [place], [address] Phone: [phone]   Fax:  [phone]  Name: Peter Yu MRN: [id number] Date of Birth: 1991-10-20

## 2015-11-09 ENCOUNTER — Ambulatory Visit (HOSPITAL_COMMUNITY): Payer: BLUE CROSS/BLUE SHIELD | Admitting: Physical Therapy

## 2015-11-09 DIAGNOSIS — R29898 Other symptoms and signs involving the musculoskeletal system: Secondary | ICD-10-CM

## 2015-11-09 DIAGNOSIS — R262 Difficulty in walking, not elsewhere classified: Secondary | ICD-10-CM

## 2015-11-09 DIAGNOSIS — R269 Unspecified abnormalities of gait and mobility: Secondary | ICD-10-CM

## 2015-11-09 DIAGNOSIS — M25672 Stiffness of left ankle, not elsewhere classified: Secondary | ICD-10-CM

## 2015-11-09 NOTE — Therapy (Signed)
[place], [place], [address] Phone: [phone]   Fax:  [phone]  Physical Therapy Treatment  Patient Details  Name: Peter Yu MRN: [id number] Date of Birth: Jan 20, 1991 No Data Recorded  Encounter Date: 11/09/2015      PT End of Session - 11/09/15 1053    Visit Number 25   Number of Visits 33   Date for PT Re-Evaluation 12/09/15   Authorization Type BCBS- 50 visit limit   Authorization Time Period 09/13/15-01/12/15   Authorization - Visit Number 25   Authorization - Number of Visits 50   PT Start Time 0850   PT Stop Time 0931   PT Time Calculation (min) 41 min   Activity Tolerance Patient tolerated treatment well   Behavior During Therapy [facility] for tasks assessed/performed      Past Medical History  Diagnosis Date  . ADHD (attention deficit hyperactivity disorder)     No past surgical history on file.  There were no vitals filed for this visit.  Visit Diagnosis:  Difficulty walking - Plan: PT plan of care cert/re-cert  Stiffness of left ankle joint - Plan: PT plan of care cert/re-cert  Weakness of left leg - Plan: PT plan of care cert/re-cert  Abnormality of gait - Plan: PT plan of care cert/re-cert      Subjective Assessment - 11/09/15 0851    Subjective Pt reports that he has had improved confidence, he has had some improvements in walking, and he feels that his balance has gotten better. He reports that he is still having difficulty with a little bit of everything, including balance, strengthening, and walking.    How long can you sit comfortably? no limitations   How long can you stand comfortably? 60 minutes   How long can you walk comfortably? 60 minutes   Currently in Pain? Yes   Pain Score 2    Pain Location Leg   Pain Orientation Left            [facility] PT Assessment - 11/09/15 0001    Assessment   Medical Diagnosis L leg fracture   Onset Date/Surgical Date 08/13/15   Next MD Visit 11/15/15   Prior Function   Level of Independence Independent   AROM   Left Ankle Dorsiflexion 9   Left Ankle Plantar Flexion 50   Left Ankle Inversion 30   Left Ankle Eversion 18   Strength   Right Hip Flexion 4+/5   Right Hip Extension 4+/5   Right Hip ABduction 4+/5   Left Hip Flexion 4+/5   Left Hip Extension 4+/5   Left Hip ABduction 4/5   Right Knee Flexion 5/5   Right Knee Extension 5/5   Left Knee Flexion 5/5   Left Knee Extension 4+/5   Left Ankle Dorsiflexion 5/5   Left Ankle Plantar Flexion 4-/5   Left Ankle Inversion 4/5   Left Ankle Eversion 4/5                     [facility] Adult PT Treatment/Exercise - 11/09/15 0001    Knee/Hip Exercises: Stretches   Gastroc Stretch 3 reps;30 seconds   Gastroc Stretch Limitations slant board   Knee/Hip Exercises: Standing   Functional Squat 10 reps   Functional Squat Limitations on power tower   Gait Training 225 feet   Manual Therapy   Manual Therapy Joint mobilization   Manual therapy comments performed following therex and ROM measurements  Joint Mobilization to talocrural and subtalar joint to improve gait mechanics   Ankle Exercises: Standing   Heel Raises 15 reps  single leg on power tower at level 20                  PT Short Term Goals - 11/09/15 1058    PT SHORT TERM GOAL #1   Title Pt will be independent with HEP.    Time 3   Period Weeks   Status Achieved   PT SHORT TERM GOAL #2   Title Improve left ankle rom by 10 degrees in all planes to improve mobility and gait mechanics.    Time 3   Period Weeks   Status Partially Met   PT SHORT TERM GOAL #3   Title Improve ankle strength to 4/5 to improve gait mechanics and ankle stability.    Time 3   Period Weeks   Status Partially Met   PT SHORT TERM GOAL #4   Title Pt will ambulate 150 feet in fracture boot with LRAD or no AD.   Time 3   Period Weeks   Status Achieved           PT Long Term Goals - 11/09/15 1058     PT LONG TERM GOAL #1   Title Pt will be independent with advanced HEP for ankle ROM, strengthening, and proprioception.   Time 6   Period Weeks   Status On-going   PT LONG TERM GOAL #2   Title Improve ankle ROM by 15 degrees in all planes to improve mobility and normalize gait mechanics.   Time 6   Period Weeks   Status On-going   PT LONG TERM GOAL #3   Title Improve left hip and ankle strength to 4+/5 or greater to improve gait mechanics.   Time 6   Period Weeks   Status On-going   PT LONG TERM GOAL #4   Title Decrease edema in L ankle evidenced by equal circumfrential measurements at superior aspect of malleoli.   Time 6   Period Weeks   Status On-going   PT LONG TERM GOAL #5   Title Pt will ambulate 1,000 feet with no AD and with proper gait mechanics to return to PLOF of community ambulation.    Time 6   Period Weeks   Status On-going   PT LONG TERM GOAL #6   Title Pt will maintain SLS on LLE for 60 seconds or greater to demonstrate good balance and proprioception in LLE.    Time 6   Period Weeks   Status On-going               Plan - 11/09/15 1054    Clinical Impression Statement Reassessment was completed in today's treatment. Pt has show improvements in ankle DF and eversion ROM, ankle strength, and functional activity tolerance, however, he continues to demonstrate joint stiffness of talocrural and subtalar joints, altered gait mechanics, and impaired balance. At this time, pt will benefit from sessions 2x per week x 4 weeks to address joint mobility, balance, gait, and LE strength in order to return him to PLOF.    PT Frequency 2x / week   PT Duration 4 weeks   PT Next Visit Plan Continue with joint mobilizations, gait training, focus on SLS and balance activities        Problem List Patient Active Problem List   Diagnosis Date Noted  . SHOULDER DISLOCATION-RECURRENT 11/06/2007    Hilma Favors,  PT, DPT [phone] 11/09/2015, 12:06 PM  [place] [address] [place], [place], [address] Phone: [phone]   Fax:  [phone]  Name: Peter Yu MRN: [id number] Date of Birth: [birth year]

## 2015-11-14 ENCOUNTER — Ambulatory Visit (HOSPITAL_COMMUNITY): Payer: BLUE CROSS/BLUE SHIELD | Admitting: Physical Therapy

## 2015-11-14 DIAGNOSIS — R29898 Other symptoms and signs involving the musculoskeletal system: Secondary | ICD-10-CM

## 2015-11-14 DIAGNOSIS — R262 Difficulty in walking, not elsewhere classified: Secondary | ICD-10-CM

## 2015-11-14 DIAGNOSIS — R269 Unspecified abnormalities of gait and mobility: Secondary | ICD-10-CM

## 2015-11-14 DIAGNOSIS — M25672 Stiffness of left ankle, not elsewhere classified: Secondary | ICD-10-CM

## 2015-11-14 NOTE — Therapy (Signed)
[facility] [address] [place], [place], [address] Phone: [phone]   Fax:  [phone]  Physical Therapy Treatment  Patient Details  Name: Peter Yu MRN: [id number] Date of Birth: Jan 09, 1991 No Data Recorded  Encounter Date: 11/14/2015      PT End of Session - 11/14/15 1100    Visit Number 26   Number of Visits 33   Date for PT Re-Evaluation 12/09/15   Authorization Type BCBS- 50 visit limit   Authorization Time Period 09/13/15-01/12/15   Authorization - Visit Number 26   Authorization - Number of Visits 50   PT Start Time 1017   PT Stop Time 1100   PT Time Calculation (min) 43 min   Activity Tolerance Patient tolerated treatment well   Behavior During Therapy [facility] for tasks assessed/performed      Past Medical History  Diagnosis Date  . ADHD (attention deficit hyperactivity disorder)     No past surgical history on file.  There were no vitals filed for this visit.  Visit Diagnosis:  Difficulty walking  Stiffness of left ankle joint  Weakness of left leg  Abnormality of gait      Subjective Assessment - 11/14/15 1021    Subjective Pt reports that his leg is feeling pretty good today.    Currently in Pain? Yes   Pain Score 3    Pain Location Leg   Pain Orientation Left                         [facility] Adult PT Treatment/Exercise - 11/14/15 0001    Knee/Hip Exercises: Stretches   Gastroc Stretch 3 reps;30 seconds   Gastroc Stretch Limitations slant board   Knee/Hip Exercises: Aerobic   Tread Mill 2.0 mph x 3 minutes   Knee/Hip Exercises: Standing   Functional Squat 15 reps   Functional Squat Limitations on power tower   Other Standing Knee Exercises sidestepping and monster walk with BTB x 2RT   Other Standing Knee Exercises tandem gait x 2 RT   Ankle Exercises: Standing   BAPS Level 5;10 reps   SLS 18" max   Heel Walk (Round Trip) 2RT   Toe Walk (Round Trip) x2RT                   PT Short Term Goals - 11/09/15 1058    PT SHORT TERM GOAL #1   Title Pt will be independent with HEP.    Time 3   Period Weeks   Status Achieved   PT SHORT TERM GOAL #2   Title Improve left ankle rom by 10 degrees in all planes to improve mobility and gait mechanics.    Time 3   Period Weeks   Status Partially Met   PT SHORT TERM GOAL #3   Title Improve ankle strength to 4/5 to improve gait mechanics and ankle stability.    Time 3   Period Weeks   Status Partially Met   PT SHORT TERM GOAL #4   Title Pt will ambulate 150 feet in fracture boot with LRAD or no AD.   Time 3   Period Weeks   Status Achieved           PT Long Term Goals - 11/09/15 1058    PT LONG TERM GOAL #1   Title Pt will be independent with advanced HEP for ankle ROM, strengthening, and proprioception.   Time 6  Period Weeks   Status On-going   PT LONG TERM GOAL #2   Title Improve ankle ROM by 15 degrees in all planes to improve mobility and normalize gait mechanics.   Time 6   Period Weeks   Status On-going   PT LONG TERM GOAL #3   Title Improve left hip and ankle strength to 4+/5 or greater to improve gait mechanics.   Time 6   Period Weeks   Status On-going   PT LONG TERM GOAL #4   Title Decrease edema in L ankle evidenced by equal circumfrential measurements at superior aspect of malleoli.   Time 6   Period Weeks   Status On-going   PT LONG TERM GOAL #5   Title Pt will ambulate 1,000 feet with no AD and with proper gait mechanics to return to PLOF of community ambulation.    Time 6   Period Weeks   Status On-going   PT LONG TERM GOAL #6   Title Pt will maintain SLS on LLE for 60 seconds or greater to demonstrate good balance and proprioception in LLE.    Time 6   Period Weeks   Status On-going               Plan - 11/14/15 1100    Clinical Impression Statement Continued with functional strengthening in today's treatment to improve gait mechanics.  Treadmill walking was resumedwith verbal cueing for proper gait mechanics, including equal step length and proper heel strike. Pt was able to maintain SLS x 18" today with no c/o pain in ankle or leg. Pt will benefit from continued gait training and resuming manual therapy to address hypomobility in talocrural joint.    PT Next Visit Plan Continue with gait training and SLS activities, resume joint mobs for ankle        Problem List Patient Active Problem List   Diagnosis Date Noted  . SHOULDER DISLOCATION-RECURRENT 11/06/2007    Hilma Favors, PT, DPT [phone] 11/14/2015, 11:54 AM  [place] [address] [place], [place], [address] Phone: [phone]   Fax:  [phone]  Name: Peter Yu MRN: [id number] Date of Birth: 19-May-1991

## 2015-11-18 ENCOUNTER — Ambulatory Visit (HOSPITAL_COMMUNITY): Payer: BLUE CROSS/BLUE SHIELD | Attending: Orthopedic Surgery | Admitting: Physical Therapy

## 2015-11-18 DIAGNOSIS — X58XXXD Exposure to other specified factors, subsequent encounter: Secondary | ICD-10-CM | POA: Diagnosis not present

## 2015-11-18 DIAGNOSIS — R29898 Other symptoms and signs involving the musculoskeletal system: Secondary | ICD-10-CM

## 2015-11-18 DIAGNOSIS — M25672 Stiffness of left ankle, not elsewhere classified: Secondary | ICD-10-CM | POA: Diagnosis present

## 2015-11-18 DIAGNOSIS — R269 Unspecified abnormalities of gait and mobility: Secondary | ICD-10-CM

## 2015-11-18 DIAGNOSIS — S82202F Unspecified fracture of shaft of left tibia, subsequent encounter for open fracture type IIIA, IIIB, or IIIC with routine healing: Secondary | ICD-10-CM | POA: Insufficient documentation

## 2015-11-18 DIAGNOSIS — R262 Difficulty in walking, not elsewhere classified: Secondary | ICD-10-CM | POA: Insufficient documentation

## 2015-11-18 DIAGNOSIS — S82402F Unspecified fracture of shaft of left fibula, subsequent encounter for open fracture type IIIA, IIIB, or IIIC with routine healing: Secondary | ICD-10-CM | POA: Diagnosis present

## 2015-11-18 NOTE — Therapy (Signed)
[place], [place], [address] Phone: [phone]   Fax:  [phone]  Physical Therapy Treatment  Patient Details  Name: Peter Yu MRN: [id number] Date of Birth: December 17, 1991 No Data Recorded  Encounter Date: 11/18/2015      PT End of Session - 11/18/15 1206    Visit Number 27   Number of Visits 33   Date for PT Re-Evaluation 12/09/15   Authorization Type BCBS- 50 visit limit   Authorization Time Period 09/13/15-01/12/15   Authorization - Visit Number 17   Authorization - Number of Visits 50   PT Start Time 1103   PT Stop Time 1156   PT Time Calculation (min) 53 min   Activity Tolerance Patient tolerated treatment well   Behavior During Therapy [facility] for tasks assessed/performed      Past Medical History  Diagnosis Date  . ADHD (attention deficit hyperactivity disorder)     No past surgical history on file.  There were no vitals filed for this visit.  Visit Diagnosis:  Difficulty walking  Stiffness of left ankle joint  Weakness of left leg  Abnormality of gait      Subjective Assessment - 11/18/15 1110    Subjective Pt reports that he saw his surgeon on Wednesday, she told him that his leg was healing nicely.    Currently in Pain? Yes   Pain Score 3    Pain Location Leg                         [facility] Adult PT Treatment/Exercise - 11/18/15 0001    Knee/Hip Exercises: Stretches   Gastroc Stretch 3 reps;30 seconds   Gastroc Stretch Limitations slant board   Knee/Hip Exercises: Standing   Gait Training emphasis on heel strike and push off, 30' x 6   Other Standing Knee Exercises sidestepping and monster walk with BTB x 2RT   Manual Therapy   Manual Therapy Joint mobilization;Myofascial release   Manual therapy comments performed prior to therex   Joint Mobilization to talocrural and subtalar joint to improve gait mechanics   Myofascial Release to L posterior tibials,  peroneals, scar tissue mobilization to distal tibia   Ankle Exercises: Standing   Heel Walk (Round Trip) 2RT   Toe Walk (Round Trip) x2RT                  PT Short Term Goals - 11/09/15 1058    PT SHORT TERM GOAL #1   Title Pt will be independent with HEP.    Time 3   Period Weeks   Status Achieved   PT SHORT TERM GOAL #2   Title Improve left ankle rom by 10 degrees in all planes to improve mobility and gait mechanics.    Time 3   Period Weeks   Status Partially Met   PT SHORT TERM GOAL #3   Title Improve ankle strength to 4/5 to improve gait mechanics and ankle stability.    Time 3   Period Weeks   Status Partially Met   PT SHORT TERM GOAL #4   Title Pt will ambulate 150 feet in fracture boot with LRAD or no AD.   Time 3   Period Weeks   Status Achieved           PT Long Term Goals - 11/09/15 1058    PT LONG TERM GOAL #1   Title Pt will be independent  with advanced HEP for ankle ROM, strengthening, and proprioception.   Time 6   Period Weeks   Status On-going   PT LONG TERM GOAL #2   Title Improve ankle ROM by 15 degrees in all planes to improve mobility and normalize gait mechanics.   Time 6   Period Weeks   Status On-going   PT LONG TERM GOAL #3   Title Improve left hip and ankle strength to 4+/5 or greater to improve gait mechanics.   Time 6   Period Weeks   Status On-going   PT LONG TERM GOAL #4   Title Decrease edema in L ankle evidenced by equal circumfrential measurements at superior aspect of malleoli.   Time 6   Period Weeks   Status On-going   PT LONG TERM GOAL #5   Title Pt will ambulate 1,000 feet with no AD and with proper gait mechanics to return to PLOF of community ambulation.    Time 6   Period Weeks   Status On-going   PT LONG TERM GOAL #6   Title Pt will maintain SLS on LLE for 60 seconds or greater to demonstrate good balance and proprioception in LLE.    Time 6   Period Weeks   Status On-going                Plan - 11/18/15 1207    Clinical Impression Statement Treatment session began with joint mobilizations to talocrural and subtalar joint to improve mobility in ankle and to improve gait mechanics, followed by myofascial release to peroneals and scar tissue mobiilzation to distal leg. Pt reported decreased difficulty with ambulation following manual therapy. Therex focused on calf and hip abductor strength to improve gait mechanics, pt required verbal and tactile cueing to maintain squat position during sidestepping and monster walks. Treatment session ended with gait training, with focus on heel strike and push off. Pt continues to demonstrate difficulty with push off during gait due to decreased strength in gastroc.    PT Next Visit Plan Continue with ankle joint mobs, gait training with emphasis on push off        Problem List Patient Active Problem List   Diagnosis Date Noted  . SHOULDER DISLOCATION-RECURRENT 11/06/2007    Hilma Favors, PT, DPT [phone] 11/18/2015, 12:14 PM  [place] [address] [place], [place], [address] Phone: [phone]   Fax:  [phone]  Name: RODGER GIANGREGORIO MRN: [id number] Date of Birth: 1991/07/26

## 2015-11-22 ENCOUNTER — Ambulatory Visit (HOSPITAL_COMMUNITY): Payer: BLUE CROSS/BLUE SHIELD

## 2015-11-22 DIAGNOSIS — M25672 Stiffness of left ankle, not elsewhere classified: Secondary | ICD-10-CM

## 2015-11-22 DIAGNOSIS — R262 Difficulty in walking, not elsewhere classified: Secondary | ICD-10-CM

## 2015-11-22 DIAGNOSIS — R269 Unspecified abnormalities of gait and mobility: Secondary | ICD-10-CM

## 2015-11-22 DIAGNOSIS — S82202F Unspecified fracture of shaft of left tibia, subsequent encounter for open fracture type IIIA, IIIB, or IIIC with routine healing: Secondary | ICD-10-CM

## 2015-11-22 DIAGNOSIS — R29898 Other symptoms and signs involving the musculoskeletal system: Secondary | ICD-10-CM

## 2015-11-22 DIAGNOSIS — S82402F Unspecified fracture of shaft of left fibula, subsequent encounter for open fracture type IIIA, IIIB, or IIIC with routine healing: Secondary | ICD-10-CM

## 2015-11-22 NOTE — Therapy (Signed)
[place], [place], [address] Phone: [phone]   Fax:  [phone]  Physical Therapy Treatment  Patient Details  Name: Peter Yu MRN: [id number] Date of Birth: 07-11-1991 No Data Recorded  Encounter Date: 11/22/2015      PT End of Session - 11/22/15 1315    Visit Number 28   Number of Visits 33   Date for PT Re-Evaluation 12/09/15   Authorization Type BCBS- 50 visit limit   Authorization Time Period 09/13/15-01/12/15   Authorization - Visit Number 28   Authorization - Number of Visits 50   PT Start Time 0934   PT Stop Time 1015   PT Time Calculation (min) 41 min   Activity Tolerance Patient tolerated treatment well   Behavior During Therapy [facility] for tasks assessed/performed      Past Medical History  Diagnosis Date  . ADHD (attention deficit hyperactivity disorder)     No past surgical history on file.  There were no vitals filed for this visit.  Visit Diagnosis:  Difficulty walking  Stiffness of left ankle joint  Weakness of left leg  Abnormality of gait  Fracture of left tibia and fibula, open type III, with routine healing, subsequent encounter      Subjective Assessment - 11/22/15 0934    Subjective Pt stated pain scale 3/10 sharp pain lower leg   Currently in Pain? Yes   Pain Score 3    Pain Location Leg   Pain Orientation Left   Pain Descriptors / Indicators Sharp;Aching            [facility] Adult PT Treatment/Exercise - 11/22/15 0001    Knee/Hip Exercises: Stretches   Gastroc Stretch 3 reps;30 seconds   Gastroc Stretch Limitations slant board   Knee/Hip Exercises: Standing   SLS Lt 20" max of 3   Gait Training emphasis on heel strike and push off, 30' x 6   Other Standing Knee Exercises sidestepping and monster walk with BTB x 2RT   Other Standing Knee Exercises 2D hip excursion walk   Manual Therapy   Manual Therapy Joint mobilization;Myofascial release   Joint  Mobilization to talocrural and subtalar joint to improve gait mechanics   Soft tissue mobilization STM to L peroneals, gastroc, soleus   Myofascial Release to L posterior tibials, peroneals, scar tissue mobilization to distal tibia   Ankle Exercises: Standing   BAPS Level 5;10 reps   SLS 20" max   Heel Walk (Round Trip) 2RT   Toe Walk (Round Trip) x2RT            PT Short Term Goals - 11/09/15 1058    PT SHORT TERM GOAL #1   Title Pt will be independent with HEP.    Time 3   Period Weeks   Status Achieved   PT SHORT TERM GOAL #2   Title Improve left ankle rom by 10 degrees in all planes to improve mobility and gait mechanics.    Time 3   Period Weeks   Status Partially Met   PT SHORT TERM GOAL #3   Title Improve ankle strength to 4/5 to improve gait mechanics and ankle stability.    Time 3   Period Weeks   Status Partially Met   PT SHORT TERM GOAL #4   Title Pt will ambulate 150 feet in fracture boot with LRAD or no AD.   Time 3   Period Weeks   Status Achieved  PT Long Term Goals - 11/09/15 1058    PT LONG TERM GOAL #1   Title Pt will be independent with advanced HEP for ankle ROM, strengthening, and proprioception.   Time 6   Period Weeks   Status On-going   PT LONG TERM GOAL #2   Title Improve ankle ROM by 15 degrees in all planes to improve mobility and normalize gait mechanics.   Time 6   Period Weeks   Status On-going   PT LONG TERM GOAL #3   Title Improve left hip and ankle strength to 4+/5 or greater to improve gait mechanics.   Time 6   Period Weeks   Status On-going   PT LONG TERM GOAL #4   Title Decrease edema in L ankle evidenced by equal circumfrential measurements at superior aspect of malleoli.   Time 6   Period Weeks   Status On-going   PT LONG TERM GOAL #5   Title Pt will ambulate 1,000 feet with no AD and with proper gait mechanics to return to PLOF of community ambulation.    Time 6   Period Weeks   Status On-going   PT  LONG TERM GOAL #6   Title Pt will maintain SLS on LLE for 60 seconds or greater to demonstrate good balance and proprioception in LLE.    Time 6   Period Weeks   Status On-going               Plan - 11/22/15 1315    Clinical Impression Statement Session focus on improving gait mechanics.  Added 2D hip excursion walk to improve weight distribution and rotation with gait with therapist facilitation with verbal cueing for heel to toe pattern and toe push off.  Noted improved gait following but does continue to require cueing to improve weight distibution and toe push off due to gastroc weakness.  Ended session with manual techniques to improve joint mobility to talocrural and subtalar joint.  No reports of increased pain through session.     PT Next Visit Plan Continue with ankle joint mobs, gait training with emphasis on push off        Problem List Patient Active Problem List   Diagnosis Date Noted  . SHOULDER DISLOCATION-RECURRENT 11/06/2007   Ihor Austin, LPTA; [place]  Aldona Lento 11/22/2015, 1:20 PM  [place] [address] [place], [place], [address] Phone: [phone]   Fax:  [phone]  Name: Peter Yu MRN: [id number] Date of Birth: [birth year]

## 2015-11-24 ENCOUNTER — Ambulatory Visit (HOSPITAL_COMMUNITY): Payer: BLUE CROSS/BLUE SHIELD

## 2015-11-24 DIAGNOSIS — M25672 Stiffness of left ankle, not elsewhere classified: Secondary | ICD-10-CM

## 2015-11-24 DIAGNOSIS — R269 Unspecified abnormalities of gait and mobility: Secondary | ICD-10-CM

## 2015-11-24 DIAGNOSIS — S82202F Unspecified fracture of shaft of left tibia, subsequent encounter for open fracture type IIIA, IIIB, or IIIC with routine healing: Secondary | ICD-10-CM

## 2015-11-24 DIAGNOSIS — R262 Difficulty in walking, not elsewhere classified: Secondary | ICD-10-CM

## 2015-11-24 DIAGNOSIS — R29898 Other symptoms and signs involving the musculoskeletal system: Secondary | ICD-10-CM

## 2015-11-24 DIAGNOSIS — S82402F Unspecified fracture of shaft of left fibula, subsequent encounter for open fracture type IIIA, IIIB, or IIIC with routine healing: Secondary | ICD-10-CM

## 2015-11-24 NOTE — Therapy (Signed)
[place], [place], [address] Phone: [phone]   Fax:  [phone]  Physical Therapy Treatment  Patient Details  Name: Peter Yu MRN: [id number] Date of Birth: [birth year] No Data Recorded  Encounter Date: 11/24/2015      PT End of Session - 11/24/15 0932    Visit Number 29   Number of Visits 33   Date for PT Re-Evaluation 12/09/15   Authorization Type BCBS- 50 visit limit   Authorization Time Period 09/13/15-01/12/15   Authorization - Visit Number 35   Authorization - Number of Visits 50   PT Start Time 0930   PT Stop Time 1015   PT Time Calculation (min) 45 min   Activity Tolerance Patient tolerated treatment well   Behavior During Therapy [facility] for tasks assessed/performed      Past Medical History  Diagnosis Date  . ADHD (attention deficit hyperactivity disorder)     No past surgical history on file.  There were no vitals filed for this visit.  Visit Diagnosis:  Difficulty walking  Stiffness of left ankle joint  Weakness of left leg  Abnormality of gait  Fracture of left tibia and fibula, open type III, with routine healing, subsequent encounter      Subjective Assessment - 11/24/15 0931    Subjective Pt stated sharp pain scale 3/10 Lt lower leg   Currently in Pain? Yes   Pain Score 3    Pain Location Leg   Pain Orientation Left   Pain Descriptors / Indicators Sharp;Aching                         [facility] Adult PT Treatment/Exercise - 11/24/15 0001    Knee/Hip Exercises: Stretches   Gastroc Stretch 3 reps;30 seconds   Gastroc Stretch Limitations slant board   Knee/Hip Exercises: Aerobic   Tread Mill 1.5->2.0 mph x 5 minutes   Knee/Hip Exercises: Standing   Functional Squat 2 sets;10 reps   Functional Squat Limitations on power tower 2nd set Lt LE only on 21 degrees   SLS Lt 3" max of 3 limited by pain   Gait Training emphasis on heel strike and push off, 30' x  6   Other Standing Knee Exercises sidestepping and monster walk with BTB x 2RT   Manual Therapy   Manual Therapy Joint mobilization;Myofascial release   Joint Mobilization to talocrural and subtalar joint to improve gait mechanics   Soft tissue mobilization STM to L peroneals, gastroc, soleus   Myofascial Release to L posterior tibials, peroneals, scar tissue mobilization to distal tibia   Ankle Exercises: Standing   Heel Walk (Round Trip) 2RT   Toe Walk (Round Trip) x2RT                  PT Short Term Goals - 11/09/15 1058    PT SHORT TERM GOAL #1   Title Pt will be independent with HEP.    Time 3   Period Weeks   Status Achieved   PT SHORT TERM GOAL #2   Title Improve left ankle rom by 10 degrees in all planes to improve mobility and gait mechanics.    Time 3   Period Weeks   Status Partially Met   PT SHORT TERM GOAL #3   Title Improve ankle strength to 4/5 to improve gait mechanics and ankle stability.    Time 3   Period Weeks   Status Partially Met  PT SHORT TERM GOAL #4   Title Pt will ambulate 150 feet in fracture boot with LRAD or no AD.   Time 3   Period Weeks   Status Achieved           PT Long Term Goals - 11/09/15 1058    PT LONG TERM GOAL #1   Title Pt will be independent with advanced HEP for ankle ROM, strengthening, and proprioception.   Time 6   Period Weeks   Status On-going   PT LONG TERM GOAL #2   Title Improve ankle ROM by 15 degrees in all planes to improve mobility and normalize gait mechanics.   Time 6   Period Weeks   Status On-going   PT LONG TERM GOAL #3   Title Improve left hip and ankle strength to 4+/5 or greater to improve gait mechanics.   Time 6   Period Weeks   Status On-going   PT LONG TERM GOAL #4   Title Decrease edema in L ankle evidenced by equal circumfrential measurements at superior aspect of malleoli.   Time 6   Period Weeks   Status On-going   PT LONG TERM GOAL #5   Title Pt will ambulate 1,000 feet  with no AD and with proper gait mechanics to return to PLOF of community ambulation.    Time 6   Period Weeks   Status On-going   PT LONG TERM GOAL #6   Title Pt will maintain SLS on LLE for 60 seconds or greater to demonstrate good balance and proprioception in LLE.    Time 6   Period Weeks   Status On-going               Plan - 11/24/15 1204    Clinical Impression Statement Session focus on gastroc/LE strengthening to improve gait mechanics and manual techniques for pain control.  Resumed treadmill gait training with verbal cueing to improve stance phase and toe push off wtih gait, pt with tendencey to hip hike Rt LE during swing phase to decrease stance phase Lt LE.  Cueing to slow down to improve gait mechancsi.  Single leg squats complete on power tower with cueing for form for strengthening.  Ended session with soft tissue mobilization to improve joint mobility to talocrural and subtalar joint as well as scar tissue massage to medial aspect on shin, improved gait mechanics and no pain reduced to 2-3/10 at end of session.     PT Next Visit Plan Continue with ankle joint mobs, gait training with emphasis on push off        Problem List Patient Active Problem List   Diagnosis Date Noted  . SHOULDER DISLOCATION-RECURRENT 11/06/2007   Ihor Austin, [place]; [place]  Aldona Lento 11/24/2015, 12:14 PM  [place] [address] [place], [place], [address] Phone: [phone]   Fax:  [phone]  Name: TAREZ BOWNS MRN: [id number] Date of Birth: 04-25-1991

## 2015-11-29 ENCOUNTER — Ambulatory Visit (HOSPITAL_COMMUNITY): Payer: BLUE CROSS/BLUE SHIELD

## 2015-11-29 DIAGNOSIS — S82202F Unspecified fracture of shaft of left tibia, subsequent encounter for open fracture type IIIA, IIIB, or IIIC with routine healing: Secondary | ICD-10-CM

## 2015-11-29 DIAGNOSIS — R262 Difficulty in walking, not elsewhere classified: Secondary | ICD-10-CM | POA: Diagnosis not present

## 2015-11-29 DIAGNOSIS — S82402F Unspecified fracture of shaft of left fibula, subsequent encounter for open fracture type IIIA, IIIB, or IIIC with routine healing: Secondary | ICD-10-CM

## 2015-11-29 DIAGNOSIS — R269 Unspecified abnormalities of gait and mobility: Secondary | ICD-10-CM

## 2015-11-29 DIAGNOSIS — R29898 Other symptoms and signs involving the musculoskeletal system: Secondary | ICD-10-CM

## 2015-11-29 DIAGNOSIS — M25672 Stiffness of left ankle, not elsewhere classified: Secondary | ICD-10-CM

## 2015-11-29 NOTE — Therapy (Signed)
[place], [place], [address] Phone: [phone]   Fax:  [phone]  Physical Therapy Treatment  Patient Details  Name: Peter Yu MRN: [id number] Date of Birth: [birth year] No Data Recorded  Encounter Date: 11/29/2015      PT End of Session - 11/29/15 0933    Visit Number 30   Number of Visits 33   Date for PT Re-Evaluation 12/09/15   Authorization Type BCBS- 50 visit limit   Authorization Time Period 09/13/15-01/12/15   Authorization - Visit Number 17   Authorization - Number of Visits 50   PT Start Time 0930   PT Stop Time 1017   PT Time Calculation (min) 47 min   Activity Tolerance Patient tolerated treatment well   Behavior During Therapy [facility] for tasks assessed/performed      Past Medical History  Diagnosis Date  . ADHD (attention deficit hyperactivity disorder)     No past surgical history on file.  There were no vitals filed for this visit.  Visit Diagnosis:  Difficulty walking  Stiffness of left ankle joint  Weakness of left leg  Abnormality of gait  Fracture of left tibia and fibula, open type III, with routine healing, subsequent encounter      Subjective Assessment - 11/29/15 0933    Subjective Pt stated Lt leg is stiff today, pain scale 4/10   Currently in Pain? Yes   Pain Score 4    Pain Location Leg   Pain Orientation Left   Pain Descriptors / Indicators Sharp;Tightness                         [facility] Adult PT Treatment/Exercise - 11/29/15 0001    Knee/Hip Exercises: Standing   Functional Squat 3 sets;15 reps   Functional Squat Limitations 1St BLE; 2nd Lt LE only 3rd Lt only heel raises   Gait Training emphasis on heel strike and push off, 30' x 6   Other Standing Knee Exercises sidestepping and monster walk with BTB x 2RT   Manual Therapy   Manual Therapy Joint mobilization;Myofascial release   Joint Mobilization to talocrural and subtalar joint to  improve gait mechanics   Soft tissue mobilization STM to L peroneals, gastroc, soleus   Myofascial Release to L posterior tibials, peroneals, scar tissue mobilization to distal tibia   Ankle Exercises: Standing   Heel Walk (Round Trip) 2RT   Toe Walk (Round Trip) x2RT            PT Short Term Goals - 11/09/15 1058    PT SHORT TERM GOAL #1   Title Pt will be independent with HEP.    Time 3   Period Weeks   Status Achieved   PT SHORT TERM GOAL #2   Title Improve left ankle rom by 10 degrees in all planes to improve mobility and gait mechanics.    Time 3   Period Weeks   Status Partially Met   PT SHORT TERM GOAL #3   Title Improve ankle strength to 4/5 to improve gait mechanics and ankle stability.    Time 3   Period Weeks   Status Partially Met   PT SHORT TERM GOAL #4   Title Pt will ambulate 150 feet in fracture boot with LRAD or no AD.   Time 3   Period Weeks   Status Achieved           PT Long Term Goals -  11/09/15 1058    PT LONG TERM GOAL #1   Title Pt will be independent with advanced HEP for ankle ROM, strengthening, and proprioception.   Time 6   Period Weeks   Status On-going   PT LONG TERM GOAL #2   Title Improve ankle ROM by 15 degrees in all planes to improve mobility and normalize gait mechanics.   Time 6   Period Weeks   Status On-going   PT LONG TERM GOAL #3   Title Improve left hip and ankle strength to 4+/5 or greater to improve gait mechanics.   Time 6   Period Weeks   Status On-going   PT LONG TERM GOAL #4   Title Decrease edema in L ankle evidenced by equal circumfrential measurements at superior aspect of malleoli.   Time 6   Period Weeks   Status On-going   PT LONG TERM GOAL #5   Title Pt will ambulate 1,000 feet with no AD and with proper gait mechanics to return to PLOF of community ambulation.    Time 6   Period Weeks   Status On-going   PT LONG TERM GOAL #6   Title Pt will maintain SLS on LLE for 60 seconds or greater to  demonstrate good balance and proprioception in LLE.    Time 6   Period Weeks   Status On-going               Plan - 11/29/15 0956    Clinical Impression Statement Began session with manual soft tissue mobilization techniques and joint mobs to gastroc/soleus and posterior tibialis as well as scar tissue myofascial release for pain relief and to reduce stiffness.  Session focus on ankle strengthening exercises to improve gait mechanics.  Pt continues to require cueing to increase stance phase to reduce antalgic gait mechanics.   No reports of increased pain at end of session.     PT Next Visit Plan Continue with ankle joint mobs, gait training with emphasis on push off        Problem List Patient Active Problem List   Diagnosis Date Noted  . SHOULDER DISLOCATION-RECURRENT 11/06/2007   Ihor Austin, [place]; [place]  Aldona Lento 11/29/2015, 12:01 PM  [place], [place], [address] Phone: [phone]   Fax:  [phone]  Name: Peter Yu MRN: [id number] Date of Birth: [birth year]

## 2015-12-01 ENCOUNTER — Ambulatory Visit (HOSPITAL_COMMUNITY): Payer: BLUE CROSS/BLUE SHIELD | Admitting: Physical Therapy

## 2015-12-01 DIAGNOSIS — M25672 Stiffness of left ankle, not elsewhere classified: Secondary | ICD-10-CM

## 2015-12-01 DIAGNOSIS — R29898 Other symptoms and signs involving the musculoskeletal system: Secondary | ICD-10-CM

## 2015-12-01 DIAGNOSIS — S82202F Unspecified fracture of shaft of left tibia, subsequent encounter for open fracture type IIIA, IIIB, or IIIC with routine healing: Secondary | ICD-10-CM

## 2015-12-01 DIAGNOSIS — S82402F Unspecified fracture of shaft of left fibula, subsequent encounter for open fracture type IIIA, IIIB, or IIIC with routine healing: Secondary | ICD-10-CM

## 2015-12-01 DIAGNOSIS — R262 Difficulty in walking, not elsewhere classified: Secondary | ICD-10-CM

## 2015-12-01 DIAGNOSIS — R269 Unspecified abnormalities of gait and mobility: Secondary | ICD-10-CM

## 2015-12-01 NOTE — Therapy (Signed)
[place], [place], [address] Phone: [phone]   Fax:  [phone]  Physical Therapy Treatment  Patient Details  Name: Peter Yu MRN: [id number] Date of Birth: 12-13-1991 No Data Recorded  Encounter Date: 12/01/2015      PT End of Session - 12/01/15 1221    Visit Number 31   Number of Visits 33   Date for PT Re-Evaluation 12/09/15   Authorization Type BCBS- 50 visit limit   Authorization Time Period 09/13/15-01/12/15   Authorization - Visit Number 31   Authorization - Number of Visits 50   PT Start Time 0930   PT Stop Time 1014   PT Time Calculation (min) 44 min   Activity Tolerance Patient tolerated treatment well   Behavior During Therapy [facility] for tasks assessed/performed      Past Medical History  Diagnosis Date  . ADHD (attention deficit hyperactivity disorder)     No past surgical history on file.  There were no vitals filed for this visit.  Visit Diagnosis:  Difficulty walking  Stiffness of left ankle joint  Weakness of left leg  Abnormality of gait  Fracture of left tibia and fibula, open type III, with routine healing, subsequent encounter      Subjective Assessment - 12/01/15 0934    Subjective Pt reports that he is really hurting today, reports that his pain is a 5 or 6/10.   Currently in Pain? Yes   Pain Score 6    Pain Location Leg   Pain Orientation Left                         [facility] Adult PT Treatment/Exercise - 12/01/15 0001    Knee/Hip Exercises: Stretches   Gastroc Stretch 3 reps;30 seconds   Gastroc Stretch Limitations slant board   Knee/Hip Exercises: Standing   Gait Training emphasis on heel strike and push off, 30' x 6   Manual Therapy   Manual Therapy Joint mobilization;Myofascial release   Joint Mobilization to talocrural and subtalar joint to improve gait mechanics   Myofascial Release to L posterior tibials, peroneals, scar tissue  mobilization to distal tibia   Ankle Exercises: Standing   Heel Raises 10 reps  unilateral, LLE   Heel Walk (Round Trip) 2RT   Toe Walk (Round Trip) x2RT                  PT Short Term Goals - 11/09/15 1058    PT SHORT TERM GOAL #1   Title Pt will be independent with HEP.    Time 3   Period Weeks   Status Achieved   PT SHORT TERM GOAL #2   Title Improve left ankle rom by 10 degrees in all planes to improve mobility and gait mechanics.    Time 3   Period Weeks   Status Partially Met   PT SHORT TERM GOAL #3   Title Improve ankle strength to 4/5 to improve gait mechanics and ankle stability.    Time 3   Period Weeks   Status Partially Met   PT SHORT TERM GOAL #4   Title Pt will ambulate 150 feet in fracture boot with LRAD or no AD.   Time 3   Period Weeks   Status Achieved           PT Long Term Goals - 11/09/15 1058    PT LONG TERM GOAL #1   Title  Pt will be independent with advanced HEP for ankle ROM, strengthening, and proprioception.   Time 6   Period Weeks   Status On-going   PT LONG TERM GOAL #2   Title Improve ankle ROM by 15 degrees in all planes to improve mobility and normalize gait mechanics.   Time 6   Period Weeks   Status On-going   PT LONG TERM GOAL #3   Title Improve left hip and ankle strength to 4+/5 or greater to improve gait mechanics.   Time 6   Period Weeks   Status On-going   PT LONG TERM GOAL #4   Title Decrease edema in L ankle evidenced by equal circumfrential measurements at superior aspect of malleoli.   Time 6   Period Weeks   Status On-going   PT LONG TERM GOAL #5   Title Pt will ambulate 1,000 feet with no AD and with proper gait mechanics to return to PLOF of community ambulation.    Time 6   Period Weeks   Status On-going   PT LONG TERM GOAL #6   Title Pt will maintain SLS on LLE for 60 seconds or greater to demonstrate good balance and proprioception in LLE.    Time 6   Period Weeks   Status On-going                Plan - 12/01/15 1221    Clinical Impression Statement Treatment session began with joint mobilizations to talocrural and subtalar joint to improve mobility and improve gait mechanics, followed by scar tissue mobilization to scar on distal medial tibia. Myofascial release performed to peroneals and posterior tibialis to improve mechanics with ankle movement and with gait. Gait training continues to focus on achieving full push off, pt still unable to fully push off on LLE due to reports of pain. Pt became frustrated in today's treatment, asking why he hasn't gotten better. Pt has admitted that he has not been compliant with HEP at home, was educated on the importance of compliance with HEP and with proper gait mechanics. Pt is able to walk with more equal weightbearing when cued, however, when he enters and leaves treatment session, he walks with antalgic gait and decreased weightbearing on LLE.    PT Next Visit Plan Continue with emphasis on push off during gait.         Problem List Patient Active Problem List   Diagnosis Date Noted  . SHOULDER DISLOCATION-RECURRENT 11/06/2007    Hilma Favors, PT, DPT [phone] 12/01/2015, 12:25 PM  [place] [address] [place], [place], [address] Phone: [phone]   Fax:  [phone]  Name: Peter Yu MRN: [id number] Date of Birth: 01-05-1991

## 2015-12-06 ENCOUNTER — Ambulatory Visit (HOSPITAL_COMMUNITY): Payer: BLUE CROSS/BLUE SHIELD | Admitting: Physical Therapy

## 2015-12-06 DIAGNOSIS — R262 Difficulty in walking, not elsewhere classified: Secondary | ICD-10-CM

## 2015-12-06 DIAGNOSIS — R29898 Other symptoms and signs involving the musculoskeletal system: Secondary | ICD-10-CM

## 2015-12-06 DIAGNOSIS — R269 Unspecified abnormalities of gait and mobility: Secondary | ICD-10-CM

## 2015-12-06 DIAGNOSIS — M25672 Stiffness of left ankle, not elsewhere classified: Secondary | ICD-10-CM

## 2015-12-06 NOTE — Therapy (Signed)
[facility] [address] [place], [place], [address] Phone: [phone]   Fax:  [phone]  Physical Therapy Treatment  Patient Details  Name: Peter Yu MRN: [id number] Date of Birth: 08-18-1991 No Data Recorded  Encounter Date: 12/06/2015      PT End of Session - 12/06/15 1430    Visit Number 32   Number of Visits 33   Authorization Type [employer]- 50 visit limit   Authorization Time Period 09/13/15-01/12/15   PT Start Time 0930   PT Stop Time 1015   PT Time Calculation (min) 45 min   Activity Tolerance Patient tolerated treatment well   Behavior During Therapy [facility] for tasks assessed/performed      Past Medical History  Diagnosis Date  . ADHD (attention deficit hyperactivity disorder)     No past surgical history on file.  There were no vitals filed for this visit.  Visit Diagnosis:  Difficulty walking  Stiffness of left ankle joint  Weakness of left leg  Abnormality of gait      Subjective Assessment - 12/06/15 0934    Subjective Pt reports that he is still having a lot of pain in his ankle lately. He feels that his leg has gotten stronger and that his walking has gotten better, but he still limps a lot. He has difficulty with carrying heavy objects. He feels that his balance is better, but it's not perfect.    How long can you sit comfortably? no limitations   How long can you stand comfortably? 60-90 minutes   How long can you walk comfortably? one to two hours   Currently in Pain? Yes   Pain Score 4    Pain Location Ankle   Pain Orientation Left            [facility] PT Assessment - 12/06/15 0001    AROM   Left Ankle Dorsiflexion 8   Left Ankle Plantar Flexion 50   Left Ankle Inversion 32   Left Ankle Eversion 25   Strength   Right Hip Flexion 4+/5   Right Hip Extension 4+/5   Right Hip ABduction 4+/5   Left Hip Flexion 4+/5   Left Hip Extension 4+/5   Left Hip ABduction 4+/5   Right Knee Flexion  5/5   Right Knee Extension 5/5   Left Knee Flexion 5/5   Left Knee Extension 4+/5   Left Ankle Dorsiflexion 5/5   Left Ankle Inversion 4+/5   Left Ankle Eversion 4+/5                     [facility] Adult PT Treatment/Exercise - 12/06/15 0001    Knee/Hip Exercises: Stretches   Gastroc Stretch 3 reps;30 seconds   Gastroc Stretch Limitations slant board   Knee/Hip Exercises: Standing   SLS LLE 27" max   Gait Training 450 feet with emphasis on heel strike and equal weightbearing   Ankle Exercises: Standing   Heel Raises 10 reps  unilateral, LLE                  PT Short Term Goals - 12/06/15 1426    PT SHORT TERM GOAL #1   Title Pt will be independent with HEP.    Time 3   Period Weeks   Status Achieved   PT SHORT TERM GOAL #2   Title Improve left ankle rom by 10 degrees in all planes to improve mobility and gait mechanics.    Time  3   Period Weeks   Status Partially Met   PT SHORT TERM GOAL #3   Title Improve ankle strength to 4/5 to improve gait mechanics and ankle stability.    Time 3   Period Weeks   Status Achieved   PT SHORT TERM GOAL #4   Title Pt will ambulate 150 feet in fracture boot with LRAD or no AD.   Time 3   Period Weeks   Status Achieved           PT Long Term Goals - 12/06/15 1426    PT LONG TERM GOAL #1   Title Pt will be independent with advanced HEP for ankle ROM, strengthening, and proprioception.   Time 6   Period Weeks   Status Achieved   PT LONG TERM GOAL #2   Title Improve ankle ROM by 15 degrees in all planes to improve mobility and normalize gait mechanics.   Time 6   Period Weeks   Status Partially Met   PT LONG TERM GOAL #3   Title Improve left hip and ankle strength to 4+/5 or greater to improve gait mechanics.   Time 6   Period Weeks   Status Partially Met   PT LONG TERM GOAL #4   Title Decrease edema in L ankle evidenced by equal circumfrential measurements at superior aspect of malleoli.   Time 6    Period Weeks   Status Not Met   PT LONG TERM GOAL #5   Title Pt will ambulate 1,000 feet with no AD and with proper gait mechanics to return to PLOF of community ambulation.    Time 6   Period Weeks   Status Not Met   PT LONG TERM GOAL #6   Title Pt will maintain SLS on LLE for 60 seconds or greater to demonstrate good balance and proprioception in LLE.    Time 6   Period Weeks   Status Not Met               Plan - 12/06/15 1430    Clinical Impression Statement Reassessment completed today. Pt demonstrates good progress in regards to ankle strength and ROM, however, he continues to demonstrate impaired gait and balance. Pt has reported that he is not as compliant with his HEP as he knows that he should be. He has met 3/6 of his LTGs, but demonstrates the greatest limitation in ambulation and balance. Discharge was discussed with pt, and he agreed that at this time that it is best to discharge from PT to allow for bone to continue to heal, and to possibly return in a couple of months if he has a change in functional status.    PT Treatment/Interventions ADLs/Self Care Home Management;Gait training;Stair training;Functional mobility training;Therapeutic activities;Therapeutic exercise;Balance training;Neuromuscular re-education;Patient/family education;Manual techniques;Scar mobilization;Passive range of motion   PT Next Visit Plan Pt discharged to HEP        Problem List Patient Active Problem List   Diagnosis Date Noted  . SHOULDER DISLOCATION-RECURRENT 11/06/2007     PHYSICAL THERAPY DISCHARGE SUMMARY  Visits from Start of Care: 32  Current functional level related to goals / functional outcomes: See above   Remaining deficits: Pt continues to demonstrate impairments in gait and balance.    Education / Equipment: HEP  Plan: Patient agrees to discharge.  Patient goals were partially met. Patient is being discharged due to being pleased with the current functional  level.  ?????      Hilma Favors,  PT, DPT [phone] 12/06/2015, 4:45 PM  [facility] [address] [place], [place], [address] Phone: [phone]   Fax:  [phone]  Name: Peter Yu MRN: [id number] Date of Birth: 20-Jun-1991

## 2015-12-08 ENCOUNTER — Encounter (HOSPITAL_COMMUNITY): Payer: BLUE CROSS/BLUE SHIELD | Admitting: Physical Therapy

## 2015-12-15 ENCOUNTER — Ambulatory Visit (INDEPENDENT_AMBULATORY_CARE_PROVIDER_SITE_OTHER): Payer: BLUE CROSS/BLUE SHIELD | Admitting: Family Medicine

## 2015-12-15 ENCOUNTER — Encounter: Payer: Self-pay | Admitting: Family Medicine

## 2015-12-15 VITALS — BP 120/70 | Temp 98.2°F | Ht 74.0 in | Wt 216.5 lb

## 2015-12-15 DIAGNOSIS — L03031 Cellulitis of right toe: Secondary | ICD-10-CM

## 2015-12-15 MED ORDER — DOXYCYCLINE HYCLATE 100 MG PO TABS: 100.0000 mg | ORAL_TABLET | Freq: Two times a day (BID) | ORAL | Status: DC
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
## 2016-05-22 DIAGNOSIS — Z029 Encounter for administrative examinations, unspecified: Secondary | ICD-10-CM

## 2017-08-11 ENCOUNTER — Observation Stay (HOSPITAL_COMMUNITY)
Admission: EM | Admit: 2017-08-11 | Discharge: 2017-08-13 | Disposition: A | Discharge status: Home / Self Care | Payer: 59 | Attending: Surgery | Admitting: Surgery

## 2017-08-11 ENCOUNTER — Emergency Department (HOSPITAL_COMMUNITY): Payer: 59

## 2017-08-11 ENCOUNTER — Encounter (HOSPITAL_COMMUNITY): Payer: Self-pay | Admitting: Emergency Medicine

## 2017-08-11 DIAGNOSIS — S42114A Nondisplaced fracture of body of scapula, right shoulder, initial encounter for closed fracture: Secondary | ICD-10-CM | POA: Insufficient documentation

## 2017-08-11 DIAGNOSIS — S2220XA Unspecified fracture of sternum, initial encounter for closed fracture: Secondary | ICD-10-CM | POA: Diagnosis present

## 2017-08-11 DIAGNOSIS — Z87891 Personal history of nicotine dependence: Secondary | ICD-10-CM | POA: Diagnosis not present

## 2017-08-11 DIAGNOSIS — S2221XA Fracture of manubrium, initial encounter for closed fracture: Principal | ICD-10-CM | POA: Insufficient documentation

## 2017-08-11 DIAGNOSIS — S2223XA Sternal manubrial dissociation, initial encounter for closed fracture: Secondary | ICD-10-CM

## 2017-08-11 DIAGNOSIS — M25511 Pain in right shoulder: Secondary | ICD-10-CM | POA: Diagnosis present

## 2017-08-11 LAB — CBC WITH DIFFERENTIAL/PLATELET
BASOS PCT: 0 %
Basophils Absolute: 0 10*3/uL (ref 0.0–0.1)
Eosinophils Absolute: 0.2 10*3/uL (ref 0.0–0.7)
Eosinophils Relative: 2 %
HEMATOCRIT: 43.7 % (ref 39.0–52.0)
HEMOGLOBIN: 15.3 g/dL (ref 13.0–17.0)
LYMPHS ABS: 2 10*3/uL (ref 0.7–4.0)
Lymphocytes Relative: 16 %
MCH: 30.7 pg (ref 26.0–34.0)
MCHC: 35 g/dL (ref 30.0–36.0)
MCV: 87.6 fL (ref 78.0–100.0)
Monocytes Absolute: 0.8 10*3/uL (ref 0.1–1.0)
Monocytes Relative: 6 %
NEUTROS ABS: 9.8 10*3/uL — AB (ref 1.7–7.7)
NEUTROS PCT: 76 %
Platelets: 207 10*3/uL (ref 150–400)
RBC: 4.99 MIL/uL (ref 4.22–5.81)
RDW: 12.4 % (ref 11.5–15.5)
WBC: 12.8 10*3/uL — AB (ref 4.0–10.5)

## 2017-08-11 LAB — I-STAT CHEM 8, ED
BUN: 18 mg/dL (ref 6–20)
CHLORIDE: 104 mmol/L (ref 101–111)
Calcium, Ion: 1.16 mmol/L (ref 1.15–1.40)
Creatinine, Ser: 0.9 mg/dL (ref 0.61–1.24)
Glucose, Bld: 111 mg/dL — ABNORMAL HIGH (ref 65–99)
HEMATOCRIT: 43 % (ref 39.0–52.0)
Hemoglobin: 14.6 g/dL (ref 13.0–17.0)
Potassium: 4.5 mmol/L (ref 3.5–5.1)
SODIUM: 142 mmol/L (ref 135–145)
TCO2: 30 mmol/L (ref 22–32)

## 2017-08-11 MED ORDER — DEXTROSE-NACL 5-0.9 % IV SOLN: INTRAVENOUS | Status: DC

## 2017-08-11 MED ORDER — METHOCARBAMOL 500 MG PO TABS
500.0000 mg | ORAL_TABLET | Freq: Four times a day (QID) | ORAL | Status: DC | PRN
  Administered 2017-08-11 – 2017-08-13 (×6): 500 mg via ORAL
  Filled 2017-08-11 (×6): qty 1

## 2017-08-11 MED ORDER — ONDANSETRON HCL 4 MG/2ML IJ SOLN
4.0000 mg | Freq: Four times a day (QID) | INTRAMUSCULAR | Status: DC | PRN
  Administered 2017-08-12 – 2017-08-13 (×2): 4 mg via INTRAVENOUS
  Filled 2017-08-11 (×2): qty 2

## 2017-08-11 MED ORDER — HYDROMORPHONE HCL 1 MG/ML IJ SOLN
1.0000 mg | INTRAMUSCULAR | Status: DC | PRN
  Administered 2017-08-12: 1 mg via INTRAVENOUS
  Filled 2017-08-11: qty 1

## 2017-08-11 MED ORDER — ONDANSETRON 4 MG PO TBDP
4.0000 mg | ORAL_TABLET | Freq: Four times a day (QID) | ORAL | Status: DC | PRN
  Filled 2017-08-11: qty 1

## 2017-08-11 MED ORDER — ONDANSETRON HCL 4 MG/2ML IJ SOLN
4.0000 mg | Freq: Once | INTRAMUSCULAR | Status: AC
  Administered 2017-08-11: 4 mg via INTRAVENOUS
  Filled 2017-08-11: qty 2

## 2017-08-11 MED ORDER — HYDROMORPHONE HCL 1 MG/ML IJ SOLN
1.0000 mg | Freq: Once | INTRAMUSCULAR | Status: AC
  Administered 2017-08-11: 1 mg via INTRAVENOUS
  Filled 2017-08-11: qty 1

## 2017-08-11 MED ORDER — ENOXAPARIN SODIUM 40 MG/0.4ML ~~LOC~~ SOLN
40.0000 mg | SUBCUTANEOUS | Status: DC
  Administered 2017-08-12: 40 mg via SUBCUTANEOUS
  Filled 2017-08-11: qty 0.4

## 2017-08-11 MED ORDER — HYDROMORPHONE HCL 1 MG/ML IJ SOLN
1.0000 mg | INTRAMUSCULAR | Status: DC | PRN
Start: 2017-08-11 — End: 2017-08-11
  Administered 2017-08-11 (×2): 1 mg via INTRAVENOUS
  Filled 2017-08-11 (×2): qty 1

## 2017-08-11 MED ORDER — IOPAMIDOL (ISOVUE-300) INJECTION 61%
INTRAVENOUS | Status: AC
  Administered 2017-08-11: 75 mL
  Filled 2017-08-11: qty 75

## 2017-08-11 MED ORDER — OXYCODONE HCL 5 MG PO TABS
5.0000 mg | ORAL_TABLET | ORAL | Status: DC | PRN
  Administered 2017-08-11 – 2017-08-13 (×7): 10 mg via ORAL
  Administered 2017-08-13: 5 mg via ORAL
  Filled 2017-08-11 (×2): qty 2
  Filled 2017-08-11: qty 1
  Filled 2017-08-11 (×6): qty 2
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
## 2017-08-11 NOTE — Progress Notes (Signed)
Patient states he no longer desire to take anymore narcotic medication for pain. Only want the Ibuprofen

## 2017-08-11 NOTE — ED Notes (Signed)
Attempted report 

## 2017-08-11 NOTE — ED Notes (Signed)
Pt to CT
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
## 2017-08-11 NOTE — Progress Notes (Signed)
Alert and oriented male admitted to room 14 with right arm in sling. Patient is ambulatory and transfers self from ED stretcher to bed. Patient ambulated to bathroom independently.  Tele in use at this time. No immediate distress noted. Respirations not labored, no SOB.

## 2017-08-12 ENCOUNTER — Encounter (HOSPITAL_COMMUNITY): Payer: Self-pay | Admitting: General Practice

## 2017-08-12 DIAGNOSIS — S2221XA Fracture of manubrium, initial encounter for closed fracture: Secondary | ICD-10-CM | POA: Diagnosis not present

## 2017-08-12 LAB — HIV ANTIBODY (ROUTINE TESTING W REFLEX): HIV SCREEN 4TH GENERATION: NONREACTIVE

## 2017-08-12 MED ORDER — TRAMADOL HCL 50 MG PO TABS: 50.0000 mg | ORAL_TABLET | Freq: Four times a day (QID) | ORAL | 0 refills | Status: DC

## 2017-08-12 MED ORDER — TRAMADOL HCL 50 MG PO TABS
50.0000 mg | ORAL_TABLET | Freq: Four times a day (QID) | ORAL | Status: DC
  Administered 2017-08-12 – 2017-08-13 (×4): 50 mg via ORAL
  Filled 2017-08-12 (×4): qty 1

## 2017-08-12 MED ORDER — METHOCARBAMOL 500 MG PO TABS: 500.0000 mg | ORAL_TABLET | Freq: Four times a day (QID) | ORAL | 1 refills | Status: DC | PRN

## 2017-08-12 MED ORDER — ONDANSETRON 4 MG PO TBDP: 4.0000 mg | ORAL_TABLET | Freq: Four times a day (QID) | ORAL | 0 refills | Status: DC | PRN

## 2017-08-12 MED ORDER — OXYCODONE HCL 5 MG PO TABS: 5.0000 mg | ORAL_TABLET | ORAL | 0 refills | Status: DC | PRN

## 2017-08-12 NOTE — Progress Notes (Signed)
Up walking in hallway at this time with family. Back pain improved from walk earlier, but still there pt states. Warm packs helped pt states. Pt encouraged to walk off and on during the night when he is awake.
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
## 2017-08-12 NOTE — Clinical Social Work Note (Signed)
Clinical Social Worker met with patient and patient family at bedside.  Patient verbalized consent to complete SBIRT with family presence.  No current drug or alcohol use.  No concerns addressed and no resources needed.  Patient plans to discharge home with girlfriend once medically stable.  Clinical Social Worker will sign off for now as social work intervention is no longer needed. Please consult [place] again if new need arises.  Barbette Or, [place]
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
## 2017-08-13 ENCOUNTER — Observation Stay (HOSPITAL_COMMUNITY): Payer: 59

## 2017-08-13 DIAGNOSIS — S2221XA Fracture of manubrium, initial encounter for closed fracture: Secondary | ICD-10-CM | POA: Diagnosis not present
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
## 2017-08-13 NOTE — Discharge Instructions (Signed)
Sternal Fracture °A sternal fracture is a break in the bone in the center of your chest (sternum or breastbone). This fracture is not dangerous unless there is also an injury to your heart or lungs, which are protected by the sternum and ribs. °What are the causes? °This condition is usually caused by a forceful injury from: °· Motor vehicle collisions. This is the most common cause. °· Contact sports. °· Physical assaults. ° °You can also have a sternal fracture without having a forceful injury if the bone becomes weakened over time (stress fracture or insufficiency fracture). °What increases the risk? °You may be at greater risk for a sternal fracture if you: °· Participate in direct contact sports, such as football or wrestling. °· Work at elevated heights, such as in construction. ° °Other risk factors for a stress or insufficiency sternal fracture include: °· Being male. °· Being a postmenopausal woman. °· Being age 50 or older. °· Having osteoporosis. °· Having severe curvature of the spine. °· Being on long-term steroid treatment. ° °What are the signs or symptoms? °Symptoms of this condition include: °· Pain over the sternum. °· Pain when pressing on the sternum. °· Pain that gets worse with deep breathing or coughing. °· Shortness of breath. °· Bruising. °· Swelling. °· A crackling sound when taking a deep breath or pressing on the sternum. ° °How is this diagnosed? °This condition is diagnosed with a medical history and physical exam. You may also have imaging tests, including: °· CT scan. °· Ultrasound. °· Chest X-rays that are taken from a side view. ° °Your health care provider may check your blood oxygen level with a pulse oximetry test. You may also have repeated electrocardiograms (ECGs) to make sure that your heart has not been injured. You may also have a blood test to check for damage to your heart muscle. °How is this treated? °Treatment depends on the severity of your injury. A sternal  fracture without any other injury (isolated sternal fracture) usually heals without treatment. You may need to limit (restrict) some activities at home and take medicine for pain relief. °In rare cases, you may need surgery to repair a sternal fracture that continues to cause severe pain or a sternal fracture that involves bones that have been moved out of position considerably (displaced fracture). °Follow these instructions at home: °· Take over-the-counter and prescription medicines only as told by your health care provider. °· Rest at home. Return to your normal activities as told by your health care provider. Ask your health care provider what activities are safe for you. °· If directed, apply ice to the injured area: °? Put ice in a plastic bag. °? Place a towel between your skin and the bag. °? Leave the ice on for 20 minutes, 2-3 times a day. °· Do not lift anything that is heavier than 10 lb (4.5 kg) until your health care provider says it is safe. °· Do not drive or operate heavy machinery while taking prescription pain medicine. °· Do not use any tobacco products, such as cigarettes, chewing tobacco, and e-cigarettes. If you need help quitting, ask your health care provider. °· Keep all follow-up visits as told by your health care provider. This is important. °Contact a health care provider if: °· Your pain medicine is not helping. °· You continue to have pain after several weeks. °· You develop a fever. °· You develop a cough and you have thick or bloody mucus (sputum). °Get help right away if: °·   You have difficulty breathing.  You have chest pain.  You have an abnormal heartbeat (palpitations).  You feel nauseous or you have pain in your abdomen. This information is not intended to replace advice given to you by your health care provider. Make sure you discuss any questions you have with your health care provider. Document Released: 07/17/2004 Document Revised: 07/31/2016 Document Reviewed:  06/29/2015 Elsevier Interactive Patient Education  2018 [employer].   Wear your sling at all times except to shower. Call Dr. Greig Right office to schedule an appointment to be seen within one week.   Scapular Fracture A scapular fracture is a break in the large, triangular bone behind your shoulder (shoulder blade or scapula). This bone makes up the socket joint of your shoulder. The scapula is well protected by muscles, so scapular fractures are unusual injuries. They often involve a lot of force. People who have a scapular fracture often have other injuries as well. These may be injuries to the lung, spine, head, shoulder, or ribs. What are the causes? Typical causes of a scapular fracture include:  A fall from a significant height.  A car or motorcycle accident.  A heavy, direct blow to the scapula.  What are the signs or symptoms? The main symptom of a scapular fracture is severe pain when you try to move your arm. Other signs and symptoms include:  Swelling behind the shoulder.  Bruising.  Holding the arm still and close to the body.  How is this diagnosed? Your health care provider may suspect a scapular fracture based on your symptoms and the details of a recent injury. A physical exam will be done. Tests may also be done to confirm the diagnosis. These may include a regular X-ray or a CT scan. Your health care provider will also use these tests to check for injuries to other parts of your body. How is this treated? Treatment options for a scapular fracture include:  Immobilization. Your health care provider may put your arm in a sling and wrap a support bandage around your chest. The health care provider will explain how to move your shoulder for the first week after your injury to prevent stiffness. The sling can be removed as your movement increases and your pain decreases.  Surgery. Surgery is rarely needed. You may need surgery if the bone pieces are out of place  (displaced fracture). You may also need surgery if the fracture causes the bone to be deformed. In this case, the broken scapula will be put back into position and held in place with a surgical plate and screws.  Physical therapy. A physical therapist will teach you exercises to stretch and strengthen your shoulder. The goal is to keep your shoulder from getting stiff or frozen. You may need to do these exercises for 6-12 months.  Follow these instructions at home:  Apply ice to the back of your shoulder: ? Put ice in a plastic bag. ? Place a towel between your skin and the bag. ? Leave the ice on for 20 minutes, 2-3 times per day. If you have a splint and a wrap, wear them all of the time. Remove them only to take a shower or to do your physical therapy exercises.  Take medicines only as directed by your health care provider.  Ask your health care provider: ? When you can stop wearing the splint and wrap. ? When you can do all of your usual activities again. Contact a health care provider if:  You have pain that is not helped by pain medicine.  You are unable to do your physical therapy because of pain or stiffness. Get help right away if:  You are short of breath.  You cough up blood.  You cannot move your arm or your fingers. This information is not intended to replace advice given to you by your health care provider. Make sure you discuss any questions you have with your health care provider. Document Released: 12/03/2005 Document Revised: 05/10/2016 Document Reviewed: 05/05/2014 Elsevier Interactive Patient Education  [employer].  1. PAIN CONTROL:  1. Pain is best controlled by a usual combination of three different methods TOGETHER:  1. Ice/Heat 2. Over the counter pain medication 3. Prescription pain medication 2. Most patients will experience some swelling and bruising around wounds. Ice packs or heating pads (30-60 minutes up to 6 times a day) will help. Use ice  for the first few days to help decrease swelling and bruising, then switch to heat to help relax tight/sore spots and speed recovery. Some people prefer to use ice alone, heat alone, alternating between ice & heat. Experiment to what works for you. Swelling and bruising can take several weeks to resolve.  3. It is helpful to take an over-the-counter pain medication regularly for the first few weeks. Choose one of the following that works best for you:  1. Naproxen (Aleve, etc) Two 220mg  tabs twice a day 2. Ibuprofen (Advil, etc) Three 200mg  tabs four times a day (every meal & bedtime) 3. Acetaminophen (Tylenol, etc) 500-650mg  four times a day (every meal & bedtime) 4. A prescription for pain medication (such as oxycodone, hydrocodone, etc) should be given to you upon discharge. Take your pain medication as prescribed.  1. If you are having problems/concerns with the prescription medicine (does not control pain, nausea, vomiting, rash, itching, etc), please call us [phone] to see if we need to switch you to a different pain medicine that will work better for you and/or control your side effect better. 2. If you need a refill on your pain medication, please contact your pharmacy. They will contact our office to request authorization. Prescriptions will not be filled after 5 pm or on week-ends. 4. Avoid getting constipated. When taking pain medications, it is common to experience some constipation. Increasing fluid intake and taking a fiber supplement (such as Metamucil, Citrucel, FiberCon, MiraLax, etc) 1-2 times a day regularly will usually help prevent this problem from occurring. A mild laxative (prune juice, Milk of Magnesia, MiraLax, etc) should be taken according to package directions if there are no bowel movements after 48 hours.  5. Watch out for diarrhea. If you have many loose bowel movements, simplify your diet to bland foods & liquids for a few days. Stop any stool softeners and decrease  your fiber supplement. Switching to mild anti-diarrheal medications (Kayopectate, Pepto Bismol) can help. If this worsens or does not improve, please call us. 6.  7. FOLLOW UP in our office  1. Please call [facility] at [phone] as needed  WHEN TO CALL us [phone]:  1. Poor pain control 2. Reactions / problems with new medications (rash/itching, nausea, etc)  3. Fever over 101.5 F (38.5 C) 4. Worsening swelling or bruising 5 any other concerns  The clinic staff is available to answer your questions during regular business hours (8:30am-5pm). Please dont hesitate to call and ask to speak to one of our nurses for clinical concerns.  If you have a medical  emergency, go to the nearest emergency room or call 911.  A surgeon from [facility] is always on call at the [facility], [place]  [address], [place], [place] [address] ?  MAIN: [phone] ? TOLL FREE: [phone] ?  FAX [phone]  [email or web address]   Concussion, Adult A concussion is a brain injury from a direct hit (blow) to the head or body. This blow causes the brain to shake quickly back and forth inside the skull. This can damage brain cells and cause chemical changes in the brain. A concussion may also be known as a mild traumatic brain injury (TBI). Concussions are usually not life-threatening, but the effects of a concussion can be serious. If you have a concussion, you are more likely to experience concussion-like symptoms after a direct blow to the head in the future. What are the causes? This condition is caused by:  A direct blow to the head, such as from running into another player during a game, being hit in a fight, or hitting your head on a hard surface.  A jolt of the head or neck that causes the brain to move back and forth inside the skull, such as in a car crash.  What are the signs or symptoms? The signs of a concussion can be  hard to notice. Early on, they may be missed by you, family members, and health care providers. You may look fine but act or feel differently. Symptoms are usually temporary, but they may last for days, weeks, or even longer. Some symptoms may appear right away but other symptoms may not show up for hours or days. Every head injury is different. Symptoms may include:  Headaches. This can include a feeling of pressure in the head.  Memory problems.  Trouble concentrating, organizing, or making decisions.  Slowness in thinking, acting or reacting, speaking, or reading.  Confusion.  Fatigue.  Changes in eating or sleeping patterns.  Problems with coordination or balance.  Nausea or vomiting.  Numbness or tingling.  Sensitivity to light or noise.  Vision or hearing problems.  Reduced sense of smell.  Irritability or mood changes.  Dizziness.  Lack of motivation.  Seeing or hearing things that other people do not see or hear (hallucinations).  How is this diagnosed? This condition is diagnosed based on:  Your symptoms.  A description of your injury.  You may also have tests, including:  Imaging tests, such as a CT scan or MRI. These are done to look for signs of brain injury.  Neuropsychological tests. These measure your thinking, understanding, learning, and remembering abilities.  How is this treated? This condition is treated with physical and mental rest and careful observation, usually at home. If the concussion is severe, you may need to stay home from work for a while. You may be referred to a concussion clinic or to other health care providers for management. It is important that you tell your health care provider if:  You are taking any medicines, including prescription medicines, over-the-counter medicines, and natural remedies. Some medicines, such as blood thinners (anticoagulants) and aspirin, may increase the chance of complications, such as  bleeding.  You are taking or have taken alcohol or illegal drugs. Alcohol and certain other drugs may slow your recovery and can put you at risk of further injury.  How fast you will recover from a concussion depends on many factors, such as how severe your concussion is, what  part of your brain was injured, how old you are, and how healthy you were before the concussion. Recovery can take time. It is important to wait to return to activity until a health care provider says it is safe to do that and your symptoms are completely gone. Follow these instructions at home: Activity  Limit activities that require a lot of thought or concentration. These may include: ? Doing homework or job-related work. ? Watching TV. ? Working on the computer. ? Playing memory games and puzzles.  Rest. Rest helps the brain to heal. Make sure you: ? Get plenty of sleep at night. Avoid staying up late at night. ? Keep the same bedtime hours on weekends and weekdays. ? Rest during the day. Take naps or rest breaks when you feel tired.  Having another concussion before the first one has healed can be dangerous. Do not do high-risk activities that could cause a second concussion, such as riding a bicycle or playing sports.  Ask your health care provider when you can return to your normal activities, such as school, work, athletics, driving, riding a bicycle, or using heavy machinery. Your ability to react may be slower after a brain injury. Never do these activities if you are dizzy. Your health care provider will likely give you a plan for gradually returning to activities. General instructions  Take over-the-counter and prescription medicines only as told by your health care provider.  Do not drink alcohol until your health care provider says you can.  If it is harder than usual to remember things, write them down.  If you are easily distracted, try to do one thing at a time. For example, do not try to watch TV  while fixing dinner.  Talk with family members or close friends when making important decisions.  Watch your symptoms and tell others to do the same. Complications sometimes occur after a concussion. Older adults with a brain injury may have a higher risk of serious complications, such as a blood clot in the brain.  Tell your teachers, school nurse, school counselor, coach, athletic trainer, or work Production designer, theatre/television/film about your injury, symptoms, and restrictions. Tell them about what you can or cannot do. They should watch for: ? Increased problems with attention or concentration. ? Increased difficulty remembering or learning new information. ? Increased time needed to complete tasks or assignments. ? Increased irritability or decreased ability to cope with stress. ? Increased symptoms.  Keep all follow-up visits as told by your health care provider. This is important. How is this prevented? It is very important to avoid another brain injury, especially as you recover. In rare cases, another injury can lead to permanent brain damage, brain swelling, or death. The risk of this is greatest during the first 7-10 days after a head injury. Avoid injuries by:  Wearing a seat belt when riding in a car.  Wearing a helmet when biking, skiing, skateboarding, skating, or doing similar activities.  Avoiding activities that could lead to a second concussion, such as contact or recreational sports, until your health care provider says it is okay.  Taking safety measures in your home, such as: ? Removing clutter and tripping hazards from floors and stairways. ? Using grab bars in bathrooms and handrails by stairs. ? Placing non-slip mats on floors and in bathtubs. ? Improving lighting in dim areas.  Contact a health care provider if:  Your symptoms get worse.  You have new symptoms.  You continue to have symptoms for  more than 2 weeks. Get help right away if:  You have severe or worsening  headaches.  You have weakness or numbness in any part of your body.  Your coordination gets worse.  You vomit repeatedly.  You are sleepier.  The pupil of one eye is larger than the other.  You have convulsions or a seizure.  Your speech is slurred.  Your fatigue, confusion, or irritability gets worse.  You cannot recognize people or places.  You have neck pain.  It is difficult to wake you up.  You have unusual behavior changes.  You lose consciousness. Summary  A concussion is a brain injury from a direct hit (blow) to the head or body.  A concussion may also be called a mild traumatic brain injury (TBI).  You may have imaging tests and neuropsychological tests to diagnose a concussion.  This condition is treated with physical and mental rest and careful observation.  Ask your health care provider when you can return to your normal activities, such as school, work, athletics, driving, riding a bicycle, or using heavy machinery. Follow safety instructions as told by your health care provider. This information is not intended to replace advice given to you by your health care provider. Make sure you discuss any questions you have with your health care provider. Document Released: 02/23/2004 Document Revised: 11/13/2016 Document Reviewed: 11/13/2016 Elsevier Interactive Patient Education  2017 [employer].
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
## 2017-08-13 NOTE — Progress Notes (Signed)
Patient ID: Peter Yu, male   DOB: 03-Oct-1991, 26 y.o.   MRN: [id number] CT head neg - films reviewed with patient. Nausea better. Will check this PM for D/C.  Violeta Gelinas, MD, MPH, FACS Trauma: [phone] General Surgery: [phone]
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
## 2017-09-27 ENCOUNTER — Ambulatory Visit: Payer: Self-pay

## 2018-07-24 ENCOUNTER — Ambulatory Visit (INDEPENDENT_AMBULATORY_CARE_PROVIDER_SITE_OTHER): Payer: 59 | Admitting: Family Medicine

## 2018-07-24 VITALS — Ht 74.0 in | Wt 238.0 lb

## 2018-07-24 DIAGNOSIS — R942 Abnormal results of pulmonary function studies: Secondary | ICD-10-CM

## 2018-07-24 NOTE — Progress Notes (Signed)
   Subjective:    Patient ID: Peter Yu, male    DOB: May 29, 1991, 27 y.o.   MRN: [id number]  HPI Patient [facility] [employer] and was told while doing the test he past,but was then told he had the breathing of a 27 year old.( he has never smoked).  Pt had pulmnoary fuction test   Pt failed depth peception,  Then went to eye doc and was told it was fine   Some ativity with new little one, works outdoors , has not noticed excessive sob. Has never smoked     Review of Systems No headache, no major weight loss or weight gain, no chest pain no back pain abdominal pain no change in bowel habits complete ROS otherwise negative     Objective:   Physical Exam   Alert vitals stable, NAD. Blood pressure good on repeat. HEENT normal. Lungs clear. Heart regular rate and rhythm.      Assessment & Plan:  Impression screening abnormality with substandard pulmonary function test obtained while applying for work at [employer].  Reports no dyspnea.  No history of smoking.  Will recommend a true pulmonary function test.  If these are normal I will dictate a letter back to the occupational health folks that his breathing is fine

## 2018-07-31 ENCOUNTER — Ambulatory Visit (HOSPITAL_COMMUNITY)
Admission: RE | Admit: 2018-07-31 | Discharge: 2018-07-31 | Disposition: A | Discharge status: Home / Self Care | Payer: 59 | Source: Ambulatory Visit | Attending: Family Medicine | Admitting: Family Medicine

## 2018-07-31 DIAGNOSIS — R942 Abnormal results of pulmonary function studies: Secondary | ICD-10-CM | POA: Diagnosis not present

## 2018-07-31 LAB — PULMONARY FUNCTION TEST
DL/VA % pred: 111 %
DL/VA: 5.48 ml/min/mmHg/L
DLCO unc % pred: 92 %
DLCO unc: 35.03 ml/min/mmHg
FEF 25-75 Post: 3.53 L/sec
FEF 25-75 Pre: 2.97 L/sec
FEF2575-%CHANGE-POST: 18 %
FEF2575-%PRED-PRE: 59 %
FEF2575-%Pred-Post: 70 %
FEV1-%Change-Post: 2 %
FEV1-%Pred-Post: 78 %
FEV1-%Pred-Pre: 76 %
FEV1-Post: 3.97 L
FEV1-Pre: 3.85 L
FEV1FVC-%CHANGE-POST: 4 %
FEV1FVC-%Pred-Pre: 93 %
FEV6-%Change-Post: -1 %
FEV6-%Pred-Post: 80 %
FEV6-%Pred-Pre: 81 %
FEV6-Post: 4.91 L
FEV6-Pre: 4.98 L
FEV6FVC-%Change-Post: 0 %
FEV6FVC-%PRED-PRE: 99 %
FEV6FVC-%Pred-Post: 100 %
FVC-%CHANGE-POST: -1 %
FVC-%PRED-POST: 79 %
FVC-%Pred-Pre: 81 %
FVC-POST: 4.94 L
FVC-PRE: 5.03 L
Post FEV1/FVC ratio: 80 %
Post FEV6/FVC ratio: 99 %
Pre FEV1/FVC ratio: 77 %
Pre FEV6/FVC Ratio: 99 %
RV % pred: 90 %
RV: 1.6 L
TLC % pred: 85 %
TLC: 6.55 L

## 2018-07-31 MED ORDER — ALBUTEROL SULFATE (2.5 MG/3ML) 0.083% IN NEBU
2.5000 mg | INHALATION_SOLUTION | Freq: Once | RESPIRATORY_TRACT | Status: AC
  Administered 2018-07-31: 2.5 mg via RESPIRATORY_TRACT

## 2018-08-05 ENCOUNTER — Other Ambulatory Visit: Payer: Self-pay | Admitting: *Deleted

## 2018-08-05 DIAGNOSIS — R942 Abnormal results of pulmonary function studies: Secondary | ICD-10-CM

## 2018-08-05 NOTE — Progress Notes (Signed)
Referral to pulmonology ordered in Epic.

## 2018-08-06 ENCOUNTER — Telehealth: Payer: Self-pay | Admitting: Family Medicine

## 2018-08-06 NOTE — Telephone Encounter (Signed)
Patient advised per Dr Brett CanalesSteve: Patient has pulmonary issues that warrant referral. We can not clear him because his pft's show a problem that is why we are setting him up with a specialist. He can have a copy but with these results-Dr Brett CanalesSteve doubts the p and g folks will clear him without a specialists assessment as to what is going on with his lung function patient verbalized understanding and copy up front for patient

## 2018-08-06 NOTE — Telephone Encounter (Signed)
See 8 15 message. hehas pulmonary issues that warrant referal. We can not clear him because his pft's show a problem that is why we are setting him up with a specialist. He can have a copy but with these results I doubt the p and g folks will clear him without a specialists assessment as to what is going on with his lung function

## 2018-08-06 NOTE — Telephone Encounter (Signed)
Patient wants to know if he is cleared to work at job at [employer] with his breathing machine he has to wear.He wants to know what is the normal per cent and wanting a copy of result to carry with him to job interview.He is coming by this afternoon and wanting to see if you have answer by then and pick up results.

## 2018-08-11 ENCOUNTER — Telehealth: Payer: Self-pay | Admitting: Family Medicine

## 2018-08-11 NOTE — Telephone Encounter (Signed)
Patient wanting to know if referral has been done for him yet. He states been a week and hasnt heard any thing yet.

## 2018-08-12 NOTE — Telephone Encounter (Signed)
Appt sch'd, pt aware °

## 2018-08-13 ENCOUNTER — Ambulatory Visit: Payer: 59 | Admitting: Pulmonary Disease

## 2018-08-13 ENCOUNTER — Encounter: Payer: Self-pay | Admitting: Pulmonary Disease

## 2018-08-13 VITALS — BP 138/88 | HR 79 | Ht 74.0 in | Wt 238.4 lb

## 2018-08-13 DIAGNOSIS — G4733 Obstructive sleep apnea (adult) (pediatric): Secondary | ICD-10-CM | POA: Diagnosis not present
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
## 2018-08-13 NOTE — Patient Instructions (Addendum)
Patient seen in the office today for questionable abnormal PFT  PFT does not show significant obstruction in his airway, possible small airway obstruction  This should not contribute any significant limitation or restriction in the ability to function/work  We did talk about possibility of obstructive sleep apnea, especially with your weight gain, history of snoring I believe this should be followed up-we will schedule you for home sleep study  Your blood pressure should also be monitored on a regular basis as it was elevated at your visit today  I will see you as needed  You are able to go back to work from my perspective

## 2018-09-24 ENCOUNTER — Telehealth: Payer: Self-pay | Admitting: Pulmonary Disease

## 2018-09-24 NOTE — Telephone Encounter (Signed)
Does not want to proceed with sleep study.  Patient can follow-up with his primary doctor  We will be glad to see if he changes his mind

## 2019-01-17 DIAGNOSIS — H40033 Anatomical narrow angle, bilateral: Secondary | ICD-10-CM | POA: Diagnosis not present

## 2019-01-17 DIAGNOSIS — H43393 Other vitreous opacities, bilateral: Secondary | ICD-10-CM | POA: Diagnosis not present

## 2019-09-11 ENCOUNTER — Ambulatory Visit (HOSPITAL_COMMUNITY)
Admission: EM | Admit: 2019-09-11 | Discharge: 2019-09-11 | Disposition: A | Discharge status: Home / Self Care | Payer: 59 | Attending: Family Medicine | Admitting: Family Medicine

## 2019-09-11 ENCOUNTER — Encounter (HOSPITAL_COMMUNITY): Payer: Self-pay | Admitting: Emergency Medicine

## 2019-09-11 ENCOUNTER — Other Ambulatory Visit: Payer: Self-pay

## 2019-09-11 DIAGNOSIS — Z20828 Contact with and (suspected) exposure to other viral communicable diseases: Secondary | ICD-10-CM

## 2019-09-11 DIAGNOSIS — Z20822 Contact with and (suspected) exposure to covid-19: Secondary | ICD-10-CM
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
## 2019-09-11 NOTE — ED Triage Notes (Signed)
PT here for covid testing due to wife's symptoms. PT has had headaches off and on for a week that he believes are due to diet changes

## 2019-09-11 NOTE — Discharge Instructions (Signed)
COVID testing done  °We will call you with any positive results.  °Use precautions ° °

## 2019-09-13 LAB — NOVEL CORONAVIRUS, NAA (HOSP ORDER, SEND-OUT TO REF LAB; TAT 18-24 HRS): SARS-CoV-2, NAA: NOT DETECTED

## 2020-01-18 ENCOUNTER — Encounter: Payer: Self-pay | Admitting: Family Medicine

## 2020-06-22 ENCOUNTER — Other Ambulatory Visit: Payer: Self-pay

## 2020-06-22 ENCOUNTER — Ambulatory Visit: Payer: 59 | Admitting: Family Medicine

## 2020-06-22 ENCOUNTER — Encounter: Payer: Self-pay | Admitting: Family Medicine

## 2020-06-22 VITALS — BP 124/78 | Temp 97.3°F | Ht 74.0 in | Wt 228.0 lb

## 2020-06-22 DIAGNOSIS — Z13220 Encounter for screening for lipoid disorders: Secondary | ICD-10-CM | POA: Diagnosis not present

## 2020-06-22 DIAGNOSIS — M7989 Other specified soft tissue disorders: Secondary | ICD-10-CM

## 2020-06-22 NOTE — Progress Notes (Signed)
   Subjective:    Patient ID: Peter Yu, male    DOB: 06-Mar-1991, 29 y.o.   MRN: [id number]  HPIcyst on right shoulder. Noticed it about one week ago.  Area on the right shoulder he noticed it about a week ago it did swell up but then it went down denies any high fever chills sweats denies redness or drainage denies any injury  Otherwise his health is good Review of Systems See above    Objective:   Physical Exam  Lungs clear respiratory rate normal heart regular no murmurs right shoulder area in the soft tissue of the deltoid it feels like a lipoma but I cannot be certain  Not hard no nodules doubt cancer    Assessment & Plan:  Screening labs recommended for his health  Possible lipoma recommend ultrasound await results may need MRI

## 2020-06-24 ENCOUNTER — Telehealth: Payer: Self-pay | Admitting: Family Medicine

## 2020-06-24 NOTE — Telephone Encounter (Signed)
[facility] - need to give ultrasound appointment information   Appt:  Friday 07/01/2020 arrive 2:15pm @ [facility] (no special instructions given)

## 2020-06-28 NOTE — Telephone Encounter (Signed)
Called pt, gave appt info

## 2020-07-01 ENCOUNTER — Ambulatory Visit (HOSPITAL_COMMUNITY): Payer: 59

## 2021-06-20 ENCOUNTER — Ambulatory Visit
Admission: EM | Admit: 2021-06-20 | Discharge: 2021-06-20 | Disposition: A | Discharge status: Home / Self Care | Payer: 59 | Attending: Family Medicine | Admitting: Family Medicine

## 2021-06-20 ENCOUNTER — Other Ambulatory Visit: Payer: Self-pay

## 2021-06-20 DIAGNOSIS — R0982 Postnasal drip: Secondary | ICD-10-CM

## 2021-06-20 DIAGNOSIS — H66001 Acute suppurative otitis media without spontaneous rupture of ear drum, right ear: Secondary | ICD-10-CM | POA: Diagnosis not present

## 2021-06-20 DIAGNOSIS — J069 Acute upper respiratory infection, unspecified: Secondary | ICD-10-CM

## 2021-06-20 MED ORDER — AMOXICILLIN 875 MG PO TABS: 875.0000 mg | ORAL_TABLET | Freq: Two times a day (BID) | ORAL | 0 refills | Status: AC

## 2021-06-20 MED ORDER — LEVOCETIRIZINE DIHYDROCHLORIDE 5 MG PO TABS: 5.0000 mg | ORAL_TABLET | Freq: Every evening | ORAL | 0 refills | Status: DC

## 2021-06-20 NOTE — ED Triage Notes (Signed)
Pt presents with intermittent ear pain and sore throat. Pt states that pain is exacerbated by movements and sneezing has been going on for few weeks
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
## 2021-07-22 ENCOUNTER — Encounter: Payer: Self-pay | Admitting: Emergency Medicine

## 2021-07-22 ENCOUNTER — Ambulatory Visit
Admission: EM | Admit: 2021-07-22 | Discharge: 2021-07-22 | Disposition: A | Discharge status: Home / Self Care | Payer: 59

## 2021-07-22 ENCOUNTER — Other Ambulatory Visit: Payer: Self-pay

## 2021-07-22 ENCOUNTER — Ambulatory Visit: Payer: 59

## 2021-07-22 DIAGNOSIS — R197 Diarrhea, unspecified: Secondary | ICD-10-CM

## 2021-07-22 NOTE — Discharge Instructions (Addendum)
Can take Imodium as needed for diarrhea Continue to push fluids Return if symptoms worsen or you develop new symptoms
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
## 2021-07-22 NOTE — ED Triage Notes (Signed)
Recent travel to [place] states he is having diarrhea and stomach tightness since Wednesday.

## 2022-01-08 ENCOUNTER — Ambulatory Visit (HOSPITAL_COMMUNITY)
Admission: RE | Admit: 2022-01-08 | Discharge: 2022-01-08 | Disposition: A | Discharge status: Home / Self Care | Payer: 59 | Source: Ambulatory Visit | Attending: Internal Medicine | Admitting: Internal Medicine

## 2022-01-08 ENCOUNTER — Other Ambulatory Visit: Payer: Self-pay

## 2022-01-08 DIAGNOSIS — R002 Palpitations: Secondary | ICD-10-CM | POA: Diagnosis present

## 2022-02-06 ENCOUNTER — Encounter (HOSPITAL_COMMUNITY): Payer: Self-pay

## 2022-02-06 ENCOUNTER — Emergency Department (HOSPITAL_COMMUNITY)
Admission: EM | Admit: 2022-02-06 | Discharge: 2022-02-06 | Disposition: A | Discharge status: Home / Self Care | Payer: 59 | Attending: Emergency Medicine | Admitting: Emergency Medicine

## 2022-02-06 ENCOUNTER — Other Ambulatory Visit: Payer: Self-pay

## 2022-02-06 ENCOUNTER — Emergency Department (HOSPITAL_COMMUNITY): Payer: 59

## 2022-02-06 DIAGNOSIS — S61012A Laceration without foreign body of left thumb without damage to nail, initial encounter: Secondary | ICD-10-CM | POA: Insufficient documentation

## 2022-02-06 DIAGNOSIS — Y92812 Truck as the place of occurrence of the external cause: Secondary | ICD-10-CM | POA: Diagnosis not present

## 2022-02-06 DIAGNOSIS — S6992XA Unspecified injury of left wrist, hand and finger(s), initial encounter: Secondary | ICD-10-CM | POA: Diagnosis present

## 2022-02-06 DIAGNOSIS — Y9389 Activity, other specified: Secondary | ICD-10-CM | POA: Insufficient documentation

## 2022-02-06 DIAGNOSIS — W208XXA Other cause of strike by thrown, projected or falling object, initial encounter: Secondary | ICD-10-CM | POA: Diagnosis not present

## 2022-02-06 DIAGNOSIS — M7989 Other specified soft tissue disorders: Secondary | ICD-10-CM | POA: Insufficient documentation
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
## 2022-02-06 NOTE — Discharge Instructions (Signed)
Please follow-up with Dr. Romeo Apple orthopedics for further evaluation of your injury.  While at home please rest, ice, elevate your hand to help reduce pain/swelling.  He can take ibuprofen and Tylenol as needed for pain.  Return to the ED for any signs of infection including swelling/redness around the wound, drainage of pus, fevers greater than 100.4, red streaking up your arm, any other new/worsening symptoms.

## 2022-02-06 NOTE — ED Notes (Signed)
Wound irrigated with cleaner.  Bandage placed.

## 2022-02-06 NOTE — ED Triage Notes (Signed)
Pt was working on a truck and it fell on his left thumb and was unable to remove it from under the truck until he had some help.

## 2022-04-08 ENCOUNTER — Ambulatory Visit
Admission: EM | Admit: 2022-04-08 | Discharge: 2022-04-08 | Disposition: A | Discharge status: Home / Self Care | Payer: 59 | Attending: Family Medicine | Admitting: Family Medicine

## 2022-04-08 DIAGNOSIS — J069 Acute upper respiratory infection, unspecified: Secondary | ICD-10-CM | POA: Diagnosis not present

## 2022-04-08 LAB — POCT RAPID STREP A (OFFICE): Rapid Strep A Screen: NEGATIVE

## 2022-04-08 MED ORDER — LIDOCAINE VISCOUS HCL 2 % MT SOLN: 10.0000 mL | OROMUCOSAL | 0 refills | Status: DC | PRN

## 2022-04-08 MED ORDER — PROMETHAZINE-DM 6.25-15 MG/5ML PO SYRP: 5.0000 mL | ORAL_SOLUTION | Freq: Four times a day (QID) | ORAL | 0 refills | Status: DC | PRN

## 2022-04-08 NOTE — ED Triage Notes (Signed)
Pt states this past Tuesday he started coughing and both ears are aching ? ?Pt states he tried some Mucinex yesterday ? ?Denies Fever ?
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
## 2022-06-05 ENCOUNTER — Ambulatory Visit: Payer: 59 | Admitting: Family Medicine

## 2022-06-08 ENCOUNTER — Ambulatory Visit: Payer: 59 | Admitting: Family Medicine

## 2022-06-08 DIAGNOSIS — J309 Allergic rhinitis, unspecified: Secondary | ICD-10-CM | POA: Diagnosis not present

## 2022-06-08 MED ORDER — MOMETASONE FUROATE 50 MCG/ACT NA SUSP: 2.0000 | Freq: Every day | NASAL | 12 refills | Status: AC

## 2022-06-08 MED ORDER — CETIRIZINE HCL 10 MG PO TABS: 10.0000 mg | ORAL_TABLET | Freq: Every day | ORAL | 1 refills | Status: DC

## 2022-06-10 DIAGNOSIS — J309 Allergic rhinitis, unspecified: Secondary | ICD-10-CM | POA: Insufficient documentation

## 2022-06-10 NOTE — Assessment & Plan Note (Signed)
Patient is well appearing.  Symptoms due to allergies. Continue Zyrtec. Adding Nasonex. Supportive care.

## 2022-07-17 IMAGING — DX DG FINGER THUMB 2+V*L*
4 series · 4 of 4 positions shown · non-contrast
Comparison: None.

CLINICAL DATA: Left thumb injury.

EXAM:
LEFT THUMB 2+V

[finger obl]
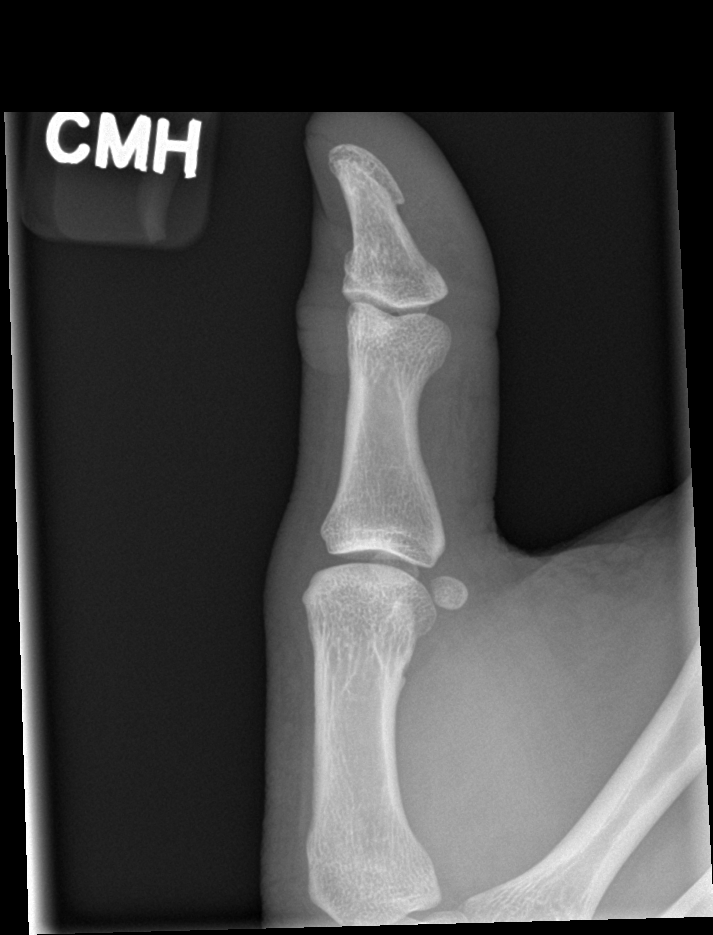

[finger ap (1 of 2)]
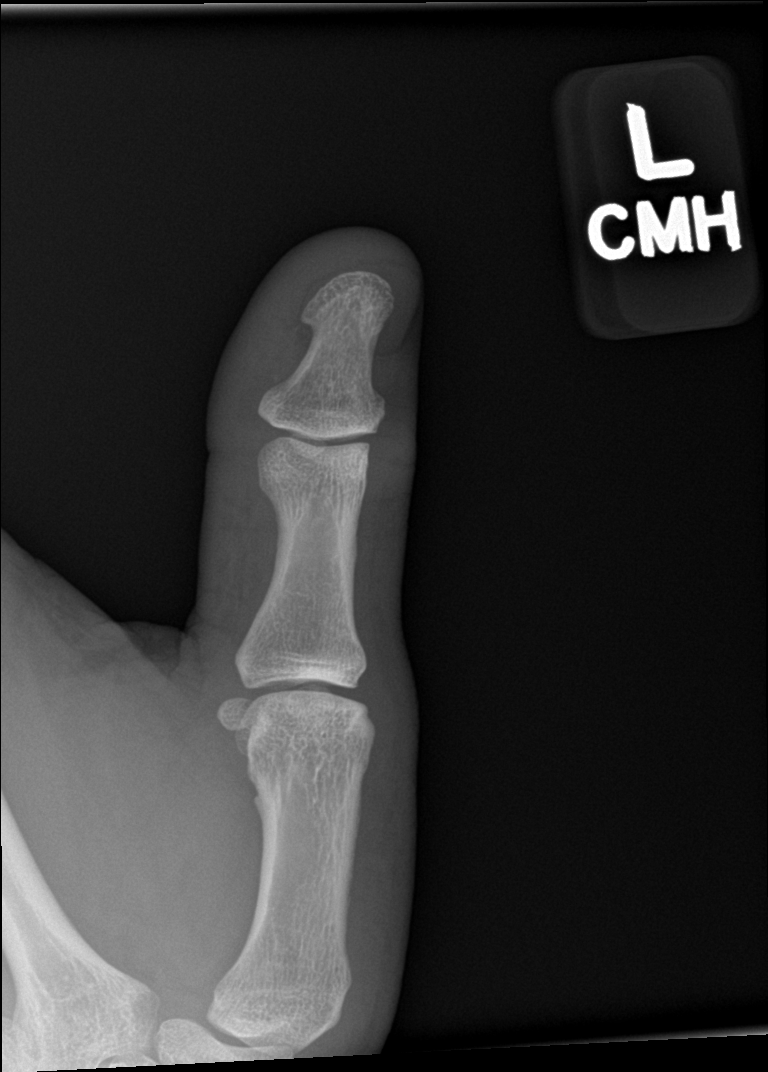

[finger lat]
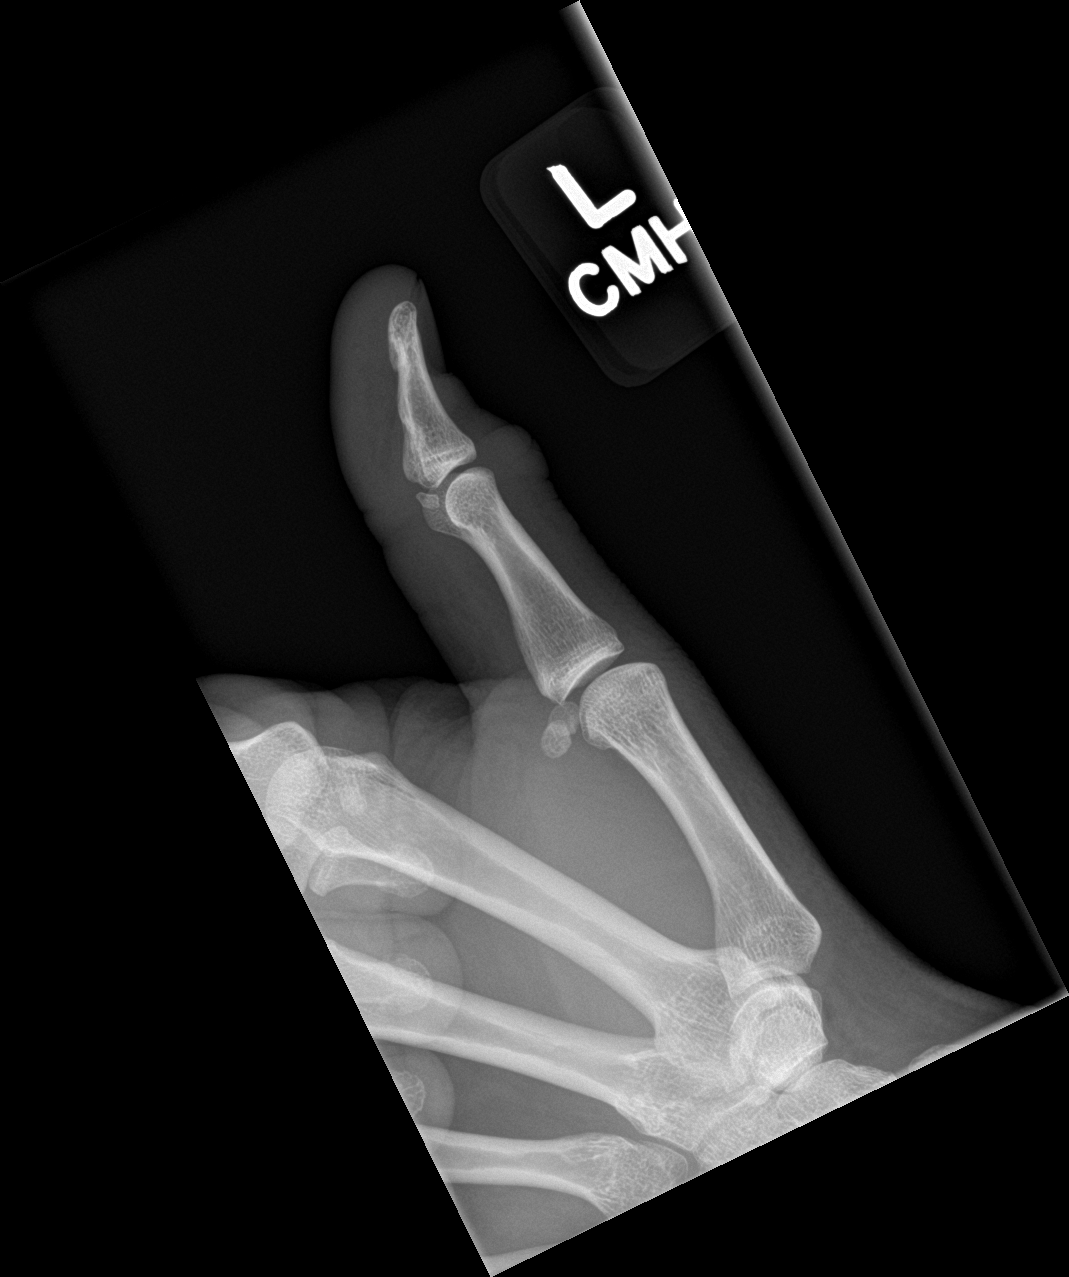

[finger ap (2 of 2)]
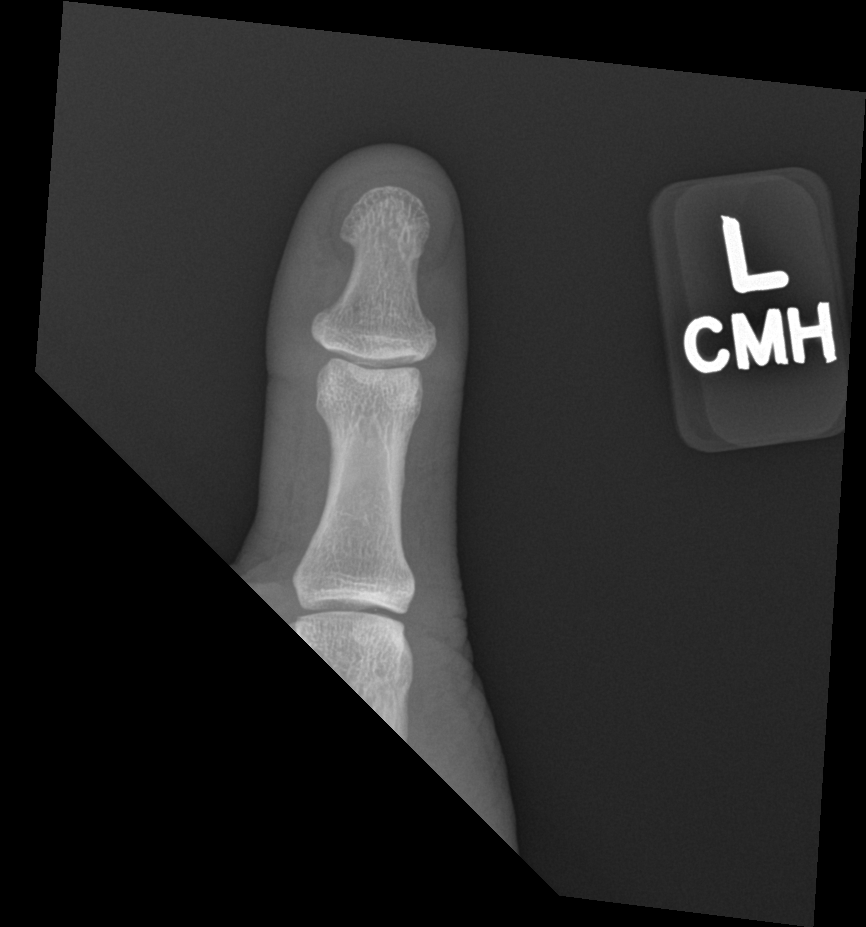

[4 of 4 positions shown; findings below may reference images not displayed]

FINDINGS: There is no evidence of fracture or dislocation. There is no
evidence of arthropathy or other focal bone abnormality. There is
generalized soft tissue swelling.
IMPRESSION: Soft tissue swelling without evidence of fractures.

## 2022-08-28 ENCOUNTER — Ambulatory Visit
Admission: EM | Admit: 2022-08-28 | Discharge: 2022-08-28 | Disposition: A | Discharge status: Home / Self Care | Payer: 59 | Attending: Nurse Practitioner | Admitting: Nurse Practitioner

## 2022-08-28 ENCOUNTER — Encounter: Payer: Self-pay | Admitting: Emergency Medicine

## 2022-08-28 DIAGNOSIS — J309 Allergic rhinitis, unspecified: Secondary | ICD-10-CM | POA: Diagnosis not present

## 2022-08-28 DIAGNOSIS — H1013 Acute atopic conjunctivitis, bilateral: Secondary | ICD-10-CM

## 2022-08-28 MED ORDER — AZELASTINE HCL 0.05 % OP SOLN: 1.0000 [drp] | Freq: Two times a day (BID) | OPHTHALMIC | 0 refills | Status: AC

## 2022-08-28 MED ORDER — CETIRIZINE HCL 10 MG PO CHEW: 10.0000 mg | CHEWABLE_TABLET | Freq: Every day | ORAL | 0 refills | Status: AC
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
## 2022-08-28 NOTE — ED Triage Notes (Signed)
Right eye redness.  Has tried allergy medication and eye drops without relief.  State No vision changes.  State he has eye drainage when he wakes up.

## 2022-08-28 NOTE — Discharge Instructions (Signed)
Use eyedrops as prescribed.  Cool compresses to the eyes to help with pain or swelling. May use the over-the-counter eyedrops such as Visine to help keep the eyes moist and decreased redness. Strict handwashing when applying medication.  Avoid rubbing or manipulating the eyes while symptoms persist. Follow-up this clinic or with your primary care physician if symptoms do not improve.

## 2023-11-01 ENCOUNTER — Ambulatory Visit (INDEPENDENT_AMBULATORY_CARE_PROVIDER_SITE_OTHER): Payer: 59 | Admitting: Family Medicine

## 2023-11-01 VITALS — BP 113/71 | HR 68 | Temp 98.6°F | Ht 74.0 in | Wt 227.2 lb

## 2023-11-01 DIAGNOSIS — M25522 Pain in left elbow: Secondary | ICD-10-CM

## 2023-11-01 DIAGNOSIS — M79605 Pain in left leg: Secondary | ICD-10-CM
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
## 2023-11-01 NOTE — Assessment & Plan Note (Addendum)
X-ray for further evaluation.

## 2023-11-01 NOTE — Assessment & Plan Note (Signed)
Concern for avulsion fracture versus olecranon bursitis although I favor the former.  X-ray for further evaluation.  Offered medication for pain and patient declines.

## 2023-11-01 NOTE — Patient Instructions (Signed)
OTC Ibuprofen or Aleve.  Xrays at the hospital. We will call with results.

## 2024-07-16 ENCOUNTER — Ambulatory Visit: Admitting: Family Medicine

## 2024-07-16 VITALS — BP 119/77 | Ht 74.0 in | Wt 235.4 lb

## 2024-07-16 DIAGNOSIS — F419 Anxiety disorder, unspecified: Secondary | ICD-10-CM

## 2024-07-16 DIAGNOSIS — F32A Depression, unspecified: Secondary | ICD-10-CM

## 2024-07-16 MED ORDER — ESCITALOPRAM OXALATE 10 MG PO TABS: 10.0000 mg | ORAL_TABLET | Freq: Every day | ORAL | 1 refills | Status: DC

## 2024-07-16 NOTE — Patient Instructions (Signed)
Medication as prescribed.  Follow up in 6 weeks. 

## 2024-07-19 DIAGNOSIS — F419 Anxiety disorder, unspecified: Secondary | ICD-10-CM | POA: Insufficient documentation

## 2024-07-19 NOTE — Progress Notes (Signed)
 f  Subjective:  Patient ID: Peter Yu, male    DOB: 08/05/1991  Age: 34 y.o. MRN: [id number]  CC:   Chief Complaint  Patient presents with   Fatigue    Low energy    HPI:  33 year old male presents for evaluation of the above  Reports that he is tired and fatigued. Has been going on for a couple of months. Reports fair diet. 6-8 hours of sleep at night. He is exercising 2x/week (rowing). 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  PHQ 9 - 11. GAD 7 score of 12.     07/16/2024    1:15 PM 11/01/2023    9:01 AM 06/08/2022    8:10 AM  Depression screen PHQ 2/9  Decreased Interest 2 0 0  Down, Depressed, Hopeless 2 0 0  PHQ - 2 Score 4 0 0  Altered sleeping 2 1   Tired, decreased energy 3 1   Change in appetite 1 0   Feeling bad or failure about yourself  0 0   Trouble concentrating 1 0   Moving slowly or fidgety/restless 0 0   Suicidal thoughts 0 0   PHQ-9 Score 11 2   Difficult doing work/chores Somewhat difficult Somewhat difficult       07/16/2024    1:16 PM 11/01/2023    9:02 AM  GAD 7 : Generalized Anxiety Score  Nervous, Anxious, on Edge 1 0  Control/stop worrying 2 0  Worry too much - different things 2 0  Trouble relaxing 2 0  Restless 2 0  Easily annoyed or irritable 3 0  Afraid - awful might happen 0 0  Total GAD 7 Score 12 0  Anxiety Difficulty Somewhat difficult Not difficult at all      Patient Active Problem List   Diagnosis Date Noted   Anxiety and depression 07/19/2024   Allergic rhinitis 06/10/2022    Social Hx   Social History   Socioeconomic History   Marital status: Married    Spouse name: Not on file   Number of children: Not on file   Years of education: Not on file   Highest education level: Not on file  Occupational History   Not on file  Tobacco Use   Smoking status: Former    Current packs/day: 0.50    Types: Cigarettes    Passive exposure: Past   Smokeless tobacco: Never   Tobacco comments:    stopped  in high school  Vaping Use   Vaping status: Former   Quit date: 12/21/2020   Devices: vape  Substance and Sexual Activity   Alcohol use: Yes    Alcohol/week: 3.0 standard drinks of alcohol    Types: 3 Cans of beer per week    Comment: occassionally   Drug use: No   Sexual activity: Not Currently    Birth control/protection: None  Other Topics Concern   Not on file  Social History Narrative   Not on file   Social Drivers of Health   Financial Resource Strain: Not on file  Food Insecurity: Low Risk  (11/06/2023)   Received from [facility]   Hunger Vital Sign    Within the past 12 months, you worried that your food would run out before you got money to buy more: Never true    Within the past 12 months, the food you bought just didn't last and you didn't have money to get more. : Never true  Transportation Needs: No Transportation Needs (  11/06/2023)   Received from [facility]    In the past 12 months, has lack of reliable transportation kept you from medical appointments, meetings, work or from getting things needed for daily living? : No  Physical Activity: Not on file  Stress: Not on file  Social Connections: Not on file    Review of Systems Per HPI  Objective:  BP 119/77   Ht 6' 2 (1.88 m)   Wt 235 lb 6.4 oz (106.8 kg)   BMI 30.22 kg/m      07/16/2024    1:14 PM 11/01/2023    8:54 AM 08/28/2022    8:47 AM  BP/Weight  Systolic BP 119 113 120  Diastolic BP 77 71 81  Wt. (Lbs) 235.4 227.2   BMI 30.22 kg/m2 29.17 kg/m2     Physical Exam Vitals and nursing note reviewed.  Constitutional:      General: He is not in acute distress.    Appearance: Normal appearance.  HENT:     Head: Normocephalic and atraumatic.  Eyes:     General:        Right eye: No discharge.        Left eye: No discharge.     Conjunctiva/sclera: Conjunctivae normal.  Pulmonary:     Effort: Pulmonary effort is normal. No respiratory distress.  Neurological:      Mental Status: He is alert.  Psychiatric:        Mood and Affect: Mood normal.        Behavior: Behavior normal.     Lab Results  Component Value Date   WBC 12.8 (H) 08/11/2017   HGB 14.6 08/11/2017   HCT 43.0 08/11/2017   PLT 207 08/11/2017   GLUCOSE 111 (H) 08/11/2017   NA 142 08/11/2017   K 4.5 08/11/2017   CL 104 08/11/2017   CREATININE 0.90 08/11/2017   BUN 18 08/11/2017     Assessment & Plan:  Anxiety and depression Assessment & Plan: This is the culprit of the fatigue. Starting Lexapro .  Orders: -     Escitalopram  Oxalate; Take 1 tablet (10 mg total) by mouth daily.  Dispense: 90 tablet; Refill: 1    Follow-up:  6 weeks.  Jacqulyn Ahle DO [facility]

## 2024-07-19 NOTE — Assessment & Plan Note (Signed)
 This is the culprit of the fatigue. Starting Lexapro .

## 2024-08-21 ENCOUNTER — Ambulatory Visit (INDEPENDENT_AMBULATORY_CARE_PROVIDER_SITE_OTHER): Admitting: Family Medicine

## 2024-08-21 DIAGNOSIS — F419 Anxiety disorder, unspecified: Secondary | ICD-10-CM

## 2024-08-21 DIAGNOSIS — F32A Depression, unspecified: Secondary | ICD-10-CM | POA: Diagnosis not present

## 2024-08-21 NOTE — Patient Instructions (Signed)
Continue your medication.  Follow up in 3 months.  

## 2024-08-23 MED ORDER — ESCITALOPRAM OXALATE 10 MG PO TABS: 10.0000 mg | ORAL_TABLET | Freq: Every day | ORAL | 1 refills | Status: AC

## 2024-08-23 NOTE — Progress Notes (Signed)
 Subjective:  Patient ID: Peter Yu, male    DOB: Nov 09, 1991  Age: 33 y.o. MRN: [id number]  CC:   Chief Complaint  Patient presents with   Depression    Follow up    HPI:  33 year old presents for follow-up regarding Zaidi and depression.  Patient states that he has had some improvement on Lexapro .  He states that he has noticed a slight decrease in his libido.  Overall he seems to be better but could stand to have more.  Will discuss potential dose increase today.  Patient Active Problem List   Diagnosis Date Noted   Anxiety and depression 07/19/2024   Allergic rhinitis 06/10/2022    Social Hx   Social History   Socioeconomic History   Marital status: Married    Spouse name: Not on file   Number of children: Not on file   Years of education: Not on file   Highest education level: Not on file  Occupational History   Not on file  Tobacco Use   Smoking status: Former    Current packs/day: 0.50    Types: Cigarettes    Passive exposure: Past   Smokeless tobacco: Never   Tobacco comments:    stopped in high school  Vaping Use   Vaping status: Former   Quit date: 12/21/2020   Devices: vape  Substance and Sexual Activity   Alcohol use: Yes    Alcohol/week: 3.0 standard drinks of alcohol    Types: 3 Cans of beer per week    Comment: occassionally   Drug use: No   Sexual activity: Not Currently    Birth control/protection: None  Other Topics Concern   Not on file  Social History Narrative   Not on file   Social Drivers of Health   Financial Resource Strain: Not on file  Food Insecurity: Low Risk  (11/06/2023)   Received from [facility]   Hunger Vital Sign    Within the past 12 months, you worried that your food would run out before you got money to buy more: Never true    Within the past 12 months, the food you bought just didn't last and you didn't have money to get more. : Never true  Transportation Needs: No Transportation Needs (11/06/2023)    Received from [facility]    In the past 12 months, has lack of reliable transportation kept you from medical appointments, meetings, work or from getting things needed for daily living? : No  Physical Activity: Not on file  Stress: Not on file  Social Connections: Not on file    Review of Systems Per HPI  Objective:  BP 128/84   Ht 6' 2 (1.88 m)   Wt 228 lb 6.4 oz (103.6 kg)   BMI 29.32 kg/m      08/21/2024   10:21 AM 07/16/2024    1:14 PM 11/01/2023    8:54 AM  BP/Weight  Systolic BP 128 119 113  Diastolic BP 84 77 71  Wt. (Lbs) 228.4 235.4 227.2  BMI 29.32 kg/m2 30.22 kg/m2 29.17 kg/m2    Physical Exam Vitals and nursing note reviewed.  Constitutional:      General: He is not in acute distress.    Appearance: Normal appearance.  Pulmonary:     Effort: Pulmonary effort is normal. No respiratory distress.  Neurological:     Mental Status: He is alert.  Psychiatric:        Mood and Affect: Mood  normal.        Behavior: Behavior normal.     Lab Results  Component Value Date   WBC 12.8 (H) 08/11/2017   HGB 14.6 08/11/2017   HCT 43.0 08/11/2017   PLT 207 08/11/2017   GLUCOSE 111 (H) 08/11/2017   NA 142 08/11/2017   K 4.5 08/11/2017   CL 104 08/11/2017   CREATININE 0.90 08/11/2017   BUN 18 08/11/2017     Assessment & Plan:  Anxiety and depression Assessment & Plan: Has had some improvement.  We discussed increasing dose and he elects to wait at this time.  Will continue current dosing of Lexapro .  Rx sent.  Orders: -     Escitalopram  Oxalate; Take 1 tablet (10 mg total) by mouth daily.  Dispense: 90 tablet; Refill: 1    Follow-up:  Return in about 3 months (around 11/20/2024).  Jacqulyn Ahle DO [facility]

## 2024-08-23 NOTE — Assessment & Plan Note (Signed)
 Has had some improvement.  We discussed increasing dose and he elects to wait at this time.  Will continue current dosing of Lexapro .  Rx sent.

## 2024-11-20 ENCOUNTER — Ambulatory Visit: Admitting: Family Medicine
